# Patient Record
Sex: Male | Born: 1950 | Race: White | Hispanic: No | Smoking: Current every day smoker
Health system: Southern US, Community
[De-identification: ages and names within clinical notes are randomized; demographics above are authoritative.]

## PROBLEM LIST (undated history)

## (undated) DIAGNOSIS — I340 Nonrheumatic mitral (valve) insufficiency: Secondary | ICD-10-CM

## (undated) DIAGNOSIS — I10 Essential (primary) hypertension: Secondary | ICD-10-CM

## (undated) DIAGNOSIS — J449 Chronic obstructive pulmonary disease, unspecified: Secondary | ICD-10-CM

## (undated) DIAGNOSIS — E119 Type 2 diabetes mellitus without complications: Secondary | ICD-10-CM

## (undated) DIAGNOSIS — E1121 Type 2 diabetes mellitus with diabetic nephropathy: Secondary | ICD-10-CM

## (undated) DIAGNOSIS — I219 Acute myocardial infarction, unspecified: Secondary | ICD-10-CM

## (undated) DIAGNOSIS — G459 Transient cerebral ischemic attack, unspecified: Secondary | ICD-10-CM

## (undated) DIAGNOSIS — Z22322 Carrier or suspected carrier of Methicillin resistant Staphylococcus aureus: Secondary | ICD-10-CM

## (undated) DIAGNOSIS — G5691 Unspecified mononeuropathy of right upper limb: Secondary | ICD-10-CM

## (undated) DIAGNOSIS — K219 Gastro-esophageal reflux disease without esophagitis: Secondary | ICD-10-CM

## (undated) DIAGNOSIS — I509 Heart failure, unspecified: Secondary | ICD-10-CM

## (undated) HISTORY — PX: APPENDECTOMY: SHX54

## (undated) HISTORY — DX: Unspecified mononeuropathy of right upper limb: G56.91

## (undated) HISTORY — DX: Chronic obstructive pulmonary disease, unspecified: J44.9

## (undated) HISTORY — DX: Acute myocardial infarction, unspecified: I21.9

## (undated) HISTORY — DX: Gastro-esophageal reflux disease without esophagitis: K21.9

---

## 2009-12-11 ENCOUNTER — Inpatient Hospital Stay: Payer: Self-pay | Admitting: Internal Medicine

## 2010-05-01 ENCOUNTER — Emergency Department: Payer: Self-pay | Admitting: Emergency Medicine

## 2010-12-09 ENCOUNTER — Ambulatory Visit: Payer: Self-pay | Admitting: Family Medicine

## 2010-12-20 ENCOUNTER — Ambulatory Visit: Payer: Self-pay | Admitting: Family Medicine

## 2011-01-19 ENCOUNTER — Ambulatory Visit: Payer: Self-pay | Admitting: Family Medicine

## 2011-03-19 ENCOUNTER — Inpatient Hospital Stay: Payer: Self-pay | Admitting: Internal Medicine

## 2011-07-03 ENCOUNTER — Ambulatory Visit: Payer: Self-pay | Admitting: Internal Medicine

## 2012-05-11 ENCOUNTER — Emergency Department: Payer: Self-pay | Admitting: Emergency Medicine

## 2012-05-11 LAB — COMPREHENSIVE METABOLIC PANEL
Alkaline Phosphatase: 84 U/L (ref 50–136)
BUN: 9 mg/dL (ref 7–18)
Bilirubin,Total: 0.5 mg/dL (ref 0.2–1.0)
Co2: 28 mmol/L (ref 21–32)
Creatinine: 0.88 mg/dL (ref 0.60–1.30)
EGFR (African American): >60
EGFR (Non-African Amer.): >60
Glucose: 294 mg/dL — ABNORMAL HIGH (ref 65–99)
Osmolality: 278 (ref 275–301)
SGOT(AST): 29 U/L (ref 15–37)
SGPT (ALT): 18 U/L (ref 12–78)
Total Protein: 7.4 g/dL (ref 6.4–8.2)

## 2012-05-11 LAB — CBC
HCT: 45.4 % (ref 40.0–52.0)
HGB: 15.6 g/dL (ref 13.0–18.0)
MCV: 95 fL (ref 80–100)
Platelet: 274 10*3/uL (ref 150–440)
RBC: 4.75 10*6/uL (ref 4.40–5.90)
WBC: 11.1 10*3/uL — ABNORMAL HIGH (ref 3.8–10.6)

## 2013-03-03 ENCOUNTER — Ambulatory Visit: Payer: Self-pay

## 2013-03-08 ENCOUNTER — Ambulatory Visit: Payer: Self-pay

## 2013-04-15 ENCOUNTER — Ambulatory Visit: Payer: Self-pay

## 2013-05-23 ENCOUNTER — Inpatient Hospital Stay: Payer: Self-pay | Admitting: Internal Medicine

## 2013-05-23 LAB — CBC
HCT: 43.6 % (ref 40.0–52.0)
HGB: 14.8 g/dL (ref 13.0–18.0)
MCHC: 33.9 g/dL (ref 32.0–36.0)
MCV: 95 fL (ref 80–100)
Platelet: 356 10*3/uL (ref 150–440)
RBC: 4.59 10*6/uL (ref 4.40–5.90)
WBC: 29.1 10*3/uL — ABNORMAL HIGH (ref 3.8–10.6)

## 2013-05-23 LAB — COMPREHENSIVE METABOLIC PANEL
Albumin: 2.4 g/dL — ABNORMAL LOW (ref 3.4–5.0)
Alkaline Phosphatase: 108 U/L (ref 50–136)
Anion Gap: 7 (ref 7–16)
BUN: 15 mg/dL (ref 7–18)
Co2: 28 mmol/L (ref 21–32)
Creatinine: 1.04 mg/dL (ref 0.60–1.30)
EGFR (African American): >60
EGFR (Non-African Amer.): >60
Osmolality: 268 (ref 275–301)
Potassium: 4.2 mmol/L (ref 3.5–5.1)
SGOT(AST): 19 U/L (ref 15–37)
Total Protein: 7.5 g/dL (ref 6.4–8.2)

## 2013-05-23 LAB — CK TOTAL AND CKMB (NOT AT ARMC)
CK, Total: 107 U/L (ref 35–232)
CK, Total: 118 U/L (ref 35–232)
CK, Total: 86 U/L (ref 35–232)

## 2013-05-23 LAB — LIPID PANEL
Cholesterol: 142 mg/dL (ref 0–200)
Ldl Cholesterol, Calc: 94 mg/dL (ref 0–100)
Triglycerides: 150 mg/dL (ref 0–200)
VLDL Cholesterol, Calc: 30 mg/dL (ref 5–40)

## 2013-05-23 LAB — HEMOGLOBIN A1C: Hemoglobin A1C: 11.7 % — ABNORMAL HIGH (ref 4.2–6.3)

## 2013-05-24 LAB — BASIC METABOLIC PANEL WITH GFR
Anion Gap: 5 — ABNORMAL LOW
BUN: 13 mg/dL
Calcium, Total: 9.1 mg/dL
Chloride: 93 mmol/L — ABNORMAL LOW
Co2: 31 mmol/L
Creatinine: 1.14 mg/dL
EGFR (African American): >60
EGFR (Non-African Amer.): >60
Glucose: 263 mg/dL — ABNORMAL HIGH
Osmolality: 268
Potassium: 3.8 mmol/L
Sodium: 129 mmol/L — ABNORMAL LOW

## 2013-05-24 LAB — CBC WITH DIFFERENTIAL/PLATELET
Basophil #: 0.1 x10 3/mm 3
Basophil %: 0.3 %
Eosinophil #: 0.1 x10 3/mm 3
Eosinophil %: 0.6 %
HCT: 40.8 %
HGB: 13.7 g/dL
Lymphocyte %: 5 %
Lymphs Abs: 1.3 x10 3/mm 3
MCH: 32 pg
MCHC: 33.5 g/dL
MCV: 95 fL
Monocyte #: 1.8 "x10 3/mm " — ABNORMAL HIGH
Monocyte %: 6.8 %
Neutrophil #: 22.5 x10 3/mm 3 — ABNORMAL HIGH
Neutrophil %: 87.3 %
Platelet: 357 x10 3/mm 3
RBC: 4.28 x10 6/mm 3 — ABNORMAL LOW
RDW: 13 %
WBC: 25.8 x10 3/mm 3 — ABNORMAL HIGH

## 2013-05-26 LAB — WBC: WBC: 21.3 10*3/uL — ABNORMAL HIGH (ref 3.8–10.6)

## 2013-05-27 LAB — CBC WITH DIFFERENTIAL/PLATELET
Basophil #: 0.1 10*3/uL (ref 0.0–0.1)
Eosinophil #: 0.4 10*3/uL (ref 0.0–0.7)
HCT: 37 % — ABNORMAL LOW (ref 40.0–52.0)
HGB: 12.5 g/dL — ABNORMAL LOW (ref 13.0–18.0)
Lymphocyte #: 1.7 10*3/uL (ref 1.0–3.6)
MCH: 32.3 pg (ref 26.0–34.0)
MCHC: 33.8 g/dL (ref 32.0–36.0)
MCV: 96 fL (ref 80–100)
Monocyte #: 0.9 x10 3/mm (ref 0.2–1.0)
Monocyte %: 5.5 %
Neutrophil #: 13.2 10*3/uL — ABNORMAL HIGH (ref 1.4–6.5)
Neutrophil %: 81 %
RBC: 3.88 10*6/uL — ABNORMAL LOW (ref 4.40–5.90)
WBC: 16.2 10*3/uL — ABNORMAL HIGH (ref 3.8–10.6)

## 2013-05-27 LAB — BASIC METABOLIC PANEL
Anion Gap: 9 (ref 7–16)
BUN: 43 mg/dL — ABNORMAL HIGH (ref 7–18)
Calcium, Total: 8.4 mg/dL — ABNORMAL LOW (ref 8.5–10.1)
Creatinine: 4.3 mg/dL — ABNORMAL HIGH (ref 0.60–1.30)
EGFR (African American): 16 — ABNORMAL LOW
Sodium: 131 mmol/L — ABNORMAL LOW (ref 136–145)

## 2013-05-27 LAB — VANCOMYCIN, TROUGH: Vancomycin, Trough: 40 ug/mL (ref 10–20)

## 2013-05-27 LAB — CREATININE, SERUM: EGFR (Non-African Amer.): 14 — ABNORMAL LOW

## 2013-05-27 LAB — WOUND CULTURE

## 2013-05-28 LAB — URINALYSIS, COMPLETE
Ketone: NEGATIVE
Leukocyte Esterase: NEGATIVE
Nitrite: NEGATIVE
Ph: 5 (ref 4.5–8.0)
Protein: NEGATIVE
RBC,UR: 1 /HPF (ref 0–5)
Specific Gravity: 1.004 (ref 1.003–1.030)
Squamous Epithelial: NONE SEEN
WBC UR: <1 /HPF (ref 0–5)

## 2013-05-28 LAB — CREATININE, SERUM: EGFR (African American): 14 — ABNORMAL LOW

## 2013-05-28 LAB — PHOSPHORUS: Phosphorus: 5.2 mg/dL — ABNORMAL HIGH (ref 2.5–4.9)

## 2013-05-28 LAB — PROTEIN / CREATININE RATIO, URINE
Creatinine, Urine: 34.7 mg/dL (ref 30.0–125.0)
Protein/Creat. Ratio: 231 mg/gCREAT — ABNORMAL HIGH (ref 0–200)

## 2013-05-29 LAB — CBC WITH DIFFERENTIAL/PLATELET
Basophil #: 0.1 10*3/uL (ref 0.0–0.1)
Basophil %: 0.9 %
HCT: 38.7 % — ABNORMAL LOW (ref 40.0–52.0)
Lymphocyte #: 1.4 10*3/uL (ref 1.0–3.6)
Lymphocyte %: 9.2 %
MCH: 33 pg (ref 26.0–34.0)
MCV: 96 fL (ref 80–100)
Monocyte %: 7.6 %
Neutrophil %: 81.2 %
Platelet: 385 10*3/uL (ref 150–440)
RBC: 4.05 10*6/uL — ABNORMAL LOW (ref 4.40–5.90)
RDW: 13.5 % (ref 11.5–14.5)
WBC: 15.6 10*3/uL — ABNORMAL HIGH (ref 3.8–10.6)

## 2013-05-29 LAB — BASIC METABOLIC PANEL
Anion Gap: 5 — ABNORMAL LOW (ref 7–16)
BUN: 50 mg/dL — ABNORMAL HIGH (ref 7–18)
Calcium, Total: 9.1 mg/dL (ref 8.5–10.1)
Co2: 28 mmol/L (ref 21–32)
EGFR (African American): 14 — ABNORMAL LOW
EGFR (Non-African Amer.): 12 — ABNORMAL LOW
Glucose: 139 mg/dL — ABNORMAL HIGH (ref 65–99)
Potassium: 4.8 mmol/L (ref 3.5–5.1)
Sodium: 136 mmol/L (ref 136–145)

## 2013-05-30 LAB — RENAL FUNCTION PANEL
Albumin: 2.1 g/dL — ABNORMAL LOW (ref 3.4–5.0)
Anion Gap: 6 — ABNORMAL LOW (ref 7–16)
Calcium, Total: 8.8 mg/dL (ref 8.5–10.1)
Chloride: 105 mmol/L (ref 98–107)
Glucose: 98 mg/dL (ref 65–99)
Osmolality: 288 (ref 275–301)
Phosphorus: 6.1 mg/dL — ABNORMAL HIGH (ref 2.5–4.9)
Sodium: 138 mmol/L (ref 136–145)

## 2013-05-31 LAB — BASIC METABOLIC PANEL
Anion Gap: 5 — ABNORMAL LOW (ref 7–16)
BUN: 50 mg/dL — ABNORMAL HIGH (ref 7–18)
Creatinine: 4.71 mg/dL — ABNORMAL HIGH (ref 0.60–1.30)
Potassium: 5.1 mmol/L (ref 3.5–5.1)

## 2013-06-01 LAB — BASIC METABOLIC PANEL
Anion Gap: 3 — ABNORMAL LOW (ref 7–16)
BUN: 49 mg/dL — ABNORMAL HIGH (ref 7–18)
Chloride: 105 mmol/L (ref 98–107)
EGFR (African American): 15 — ABNORMAL LOW
EGFR (Non-African Amer.): 13 — ABNORMAL LOW
Glucose: 83 mg/dL (ref 65–99)
Osmolality: 284 (ref 275–301)
Sodium: 136 mmol/L (ref 136–145)

## 2013-06-02 LAB — CBC WITH DIFFERENTIAL/PLATELET
Basophil %: 0.8 %
Eosinophil #: 0.2 10*3/uL (ref 0.0–0.7)
Eosinophil %: 1.3 %
Lymphocyte #: 2.1 10*3/uL (ref 1.0–3.6)
Lymphocyte %: 14.4 %
MCHC: 33.8 g/dL (ref 32.0–36.0)
Monocyte #: 1.2 x10 3/mm — ABNORMAL HIGH (ref 0.2–1.0)
Neutrophil #: 10.8 10*3/uL — ABNORMAL HIGH (ref 1.4–6.5)
Neutrophil %: 75.4 %
Platelet: 462 10*3/uL — ABNORMAL HIGH (ref 150–440)
RDW: 13.7 % (ref 11.5–14.5)

## 2013-06-02 LAB — BASIC METABOLIC PANEL
BUN: 44 mg/dL — ABNORMAL HIGH (ref 7–18)
Calcium, Total: 9.2 mg/dL (ref 8.5–10.1)
Chloride: 104 mmol/L (ref 98–107)
Creatinine: 4.61 mg/dL — ABNORMAL HIGH (ref 0.60–1.30)
Osmolality: 284 (ref 275–301)
Potassium: 5.2 mmol/L — ABNORMAL HIGH (ref 3.5–5.1)

## 2013-06-02 LAB — VANCOMYCIN, TROUGH: Vancomycin, Trough: 14 ug/mL (ref 10–20)

## 2013-06-03 LAB — BASIC METABOLIC PANEL
Anion Gap: 5 — ABNORMAL LOW (ref 7–16)
BUN: 43 mg/dL — ABNORMAL HIGH (ref 7–18)
Calcium, Total: 9.1 mg/dL (ref 8.5–10.1)
EGFR (African American): 15 — ABNORMAL LOW
EGFR (Non-African Amer.): 13 — ABNORMAL LOW
Osmolality: 284 (ref 275–301)

## 2013-06-04 LAB — BASIC METABOLIC PANEL
BUN: 40 mg/dL — ABNORMAL HIGH (ref 7–18)
Chloride: 103 mmol/L (ref 98–107)
Creatinine: 4.07 mg/dL — ABNORMAL HIGH (ref 0.60–1.30)
Osmolality: 279 (ref 275–301)

## 2013-06-05 LAB — BASIC METABOLIC PANEL
Anion Gap: 7 (ref 7–16)
BUN: 38 mg/dL — ABNORMAL HIGH (ref 7–18)
Calcium, Total: 9.3 mg/dL (ref 8.5–10.1)
Chloride: 103 mmol/L (ref 98–107)
Creatinine: 3.9 mg/dL — ABNORMAL HIGH (ref 0.60–1.30)
EGFR (African American): 18 — ABNORMAL LOW
Glucose: 84 mg/dL (ref 65–99)
Osmolality: 278 (ref 275–301)
Sodium: 135 mmol/L — ABNORMAL LOW (ref 136–145)

## 2013-06-06 LAB — BASIC METABOLIC PANEL
BUN: 37 mg/dL — ABNORMAL HIGH (ref 7–18)
Calcium, Total: 8.8 mg/dL (ref 8.5–10.1)
Creatinine: 3.85 mg/dL — ABNORMAL HIGH (ref 0.60–1.30)
EGFR (African American): 18 — ABNORMAL LOW
EGFR (Non-African Amer.): 16 — ABNORMAL LOW
Glucose: 131 mg/dL — ABNORMAL HIGH (ref 65–99)
Osmolality: 281 (ref 275–301)
Potassium: 4.8 mmol/L (ref 3.5–5.1)

## 2013-06-06 LAB — PROTEIN ELECTROPHORESIS(ARMC)

## 2013-06-09 LAB — BASIC METABOLIC PANEL
Anion Gap: 6 — ABNORMAL LOW (ref 7–16)
BUN: 37 mg/dL — ABNORMAL HIGH (ref 7–18)
Calcium, Total: 9.1 mg/dL (ref 8.5–10.1)
Co2: 26 mmol/L (ref 21–32)
EGFR (Non-African Amer.): 20 — ABNORMAL LOW
Glucose: 229 mg/dL — ABNORMAL HIGH (ref 65–99)
Potassium: 4.6 mmol/L (ref 3.5–5.1)
Sodium: 129 mmol/L — ABNORMAL LOW (ref 136–145)

## 2013-07-11 LAB — COMPREHENSIVE METABOLIC PANEL
Alkaline Phosphatase: 99 U/L
Anion Gap: 9 (ref 7–16)
Calcium, Total: 9.5 mg/dL (ref 8.5–10.1)
Chloride: 93 mmol/L — ABNORMAL LOW (ref 98–107)
Co2: 25 mmol/L (ref 21–32)
EGFR (African American): >60
EGFR (Non-African Amer.): 56 — ABNORMAL LOW
Osmolality: 273 (ref 275–301)
Potassium: 4.3 mmol/L (ref 3.5–5.1)
SGPT (ALT): 28 U/L (ref 12–78)
Sodium: 127 mmol/L — ABNORMAL LOW (ref 136–145)
Total Protein: 8.6 g/dL — ABNORMAL HIGH (ref 6.4–8.2)

## 2013-07-11 LAB — CBC
HCT: 35.4 % — ABNORMAL LOW (ref 40.0–52.0)
MCH: 29.1 pg (ref 26.0–34.0)
MCHC: 31.8 g/dL — ABNORMAL LOW (ref 32.0–36.0)
MCV: 92 fL (ref 80–100)
Platelet: 395 10*3/uL (ref 150–440)
RBC: 3.87 10*6/uL — ABNORMAL LOW (ref 4.40–5.90)
RDW: 13.9 % (ref 11.5–14.5)
WBC: 18.9 10*3/uL — ABNORMAL HIGH (ref 3.8–10.6)

## 2013-07-11 LAB — LIPASE, BLOOD: Lipase: 248 U/L (ref 73–393)

## 2013-07-12 ENCOUNTER — Inpatient Hospital Stay: Payer: Self-pay | Admitting: Internal Medicine

## 2013-07-12 LAB — URINALYSIS, COMPLETE
Bilirubin,UR: NEGATIVE
Blood: NEGATIVE
Glucose,UR: >=500 mg/dL (ref 0–75)
Hyaline Cast: 1
Nitrite: NEGATIVE
RBC,UR: 8 /HPF (ref 0–5)
Specific Gravity: 1.014 (ref 1.003–1.030)
WBC UR: 11 /HPF (ref 0–5)

## 2013-07-13 LAB — BASIC METABOLIC PANEL
Anion Gap: 5 — ABNORMAL LOW (ref 7–16)
BUN: 17 mg/dL (ref 7–18)
Calcium, Total: 8.9 mg/dL (ref 8.5–10.1)
Co2: 30 mmol/L (ref 21–32)
EGFR (African American): >60
EGFR (Non-African Amer.): >60
Glucose: 253 mg/dL — ABNORMAL HIGH (ref 65–99)
Osmolality: 275 (ref 275–301)
Sodium: 132 mmol/L — ABNORMAL LOW (ref 136–145)

## 2013-07-13 LAB — CBC WITH DIFFERENTIAL/PLATELET
Basophil #: 0.1 10*3/uL (ref 0.0–0.1)
Eosinophil #: 0.4 10*3/uL (ref 0.0–0.7)
Eosinophil %: 3.2 %
HGB: 10.4 g/dL — ABNORMAL LOW (ref 13.0–18.0)
MCH: 30.2 pg (ref 26.0–34.0)
MCHC: 32.6 g/dL (ref 32.0–36.0)
Monocyte #: 1.1 x10 3/mm — ABNORMAL HIGH (ref 0.2–1.0)
Monocyte %: 7.8 %
Neutrophil #: 9.7 10*3/uL — ABNORMAL HIGH (ref 1.4–6.5)
RBC: 3.45 10*6/uL — ABNORMAL LOW (ref 4.40–5.90)
RDW: 14.2 % (ref 11.5–14.5)
WBC: 13.8 10*3/uL — ABNORMAL HIGH (ref 3.8–10.6)

## 2013-07-13 LAB — CLOSTRIDIUM DIFFICILE(ARMC)

## 2013-07-14 LAB — CBC WITH DIFFERENTIAL/PLATELET
Basophil #: 0.1 10*3/uL (ref 0.0–0.1)
Basophil %: 0.7 %
HCT: 32.5 % — ABNORMAL LOW (ref 40.0–52.0)
Lymphocyte #: 2.1 10*3/uL (ref 1.0–3.6)
Lymphocyte %: 18.7 %
MCH: 32.1 pg (ref 26.0–34.0)
MCV: 92 fL (ref 80–100)
Monocyte #: 0.8 x10 3/mm (ref 0.2–1.0)
Monocyte %: 6.9 %
Neutrophil %: 69.1 %
Platelet: 410 10*3/uL (ref 150–440)
RDW: 14 % (ref 11.5–14.5)
WBC: 11.3 10*3/uL — ABNORMAL HIGH (ref 3.8–10.6)

## 2013-12-25 ENCOUNTER — Inpatient Hospital Stay: Payer: Self-pay | Admitting: Internal Medicine

## 2013-12-25 LAB — CBC
HCT: 45.2 % (ref 40.0–52.0)
HGB: 15.3 g/dL (ref 13.0–18.0)
MCH: 31.6 pg (ref 26.0–34.0)
MCHC: 33.8 g/dL (ref 32.0–36.0)
MCV: 94 fL (ref 80–100)
PLATELETS: 328 10*3/uL (ref 150–440)
RBC: 4.84 10*6/uL (ref 4.40–5.90)
RDW: 13.7 % (ref 11.5–14.5)
WBC: 17.9 10*3/uL — ABNORMAL HIGH (ref 3.8–10.6)

## 2013-12-25 LAB — URINALYSIS, COMPLETE
BILIRUBIN, UR: NEGATIVE
Bacteria: NONE SEEN
Blood: NEGATIVE
Hyaline Cast: 2
Nitrite: NEGATIVE
PH: 5 (ref 4.5–8.0)
Protein: NEGATIVE
RBC,UR: 2 /HPF (ref 0–5)
SPECIFIC GRAVITY: 1.029 (ref 1.003–1.030)
WBC UR: 7 /HPF (ref 0–5)

## 2013-12-25 LAB — COMPREHENSIVE METABOLIC PANEL
ALBUMIN: 3.3 g/dL — AB (ref 3.4–5.0)
ALT: 18 U/L (ref 12–78)
AST: 18 U/L (ref 15–37)
Alkaline Phosphatase: 79 U/L
Anion Gap: 8 (ref 7–16)
BUN: 12 mg/dL (ref 7–18)
Bilirubin,Total: 0.7 mg/dL (ref 0.2–1.0)
CALCIUM: 8.8 mg/dL (ref 8.5–10.1)
CHLORIDE: 100 mmol/L (ref 98–107)
CO2: 25 mmol/L (ref 21–32)
Creatinine: 1.16 mg/dL (ref 0.60–1.30)
GLUCOSE: 290 mg/dL — AB (ref 65–99)
Osmolality: 277 (ref 275–301)
Potassium: 4.1 mmol/L (ref 3.5–5.1)
SODIUM: 133 mmol/L — AB (ref 136–145)
Total Protein: 7.8 g/dL (ref 6.4–8.2)

## 2013-12-27 LAB — CBC WITH DIFFERENTIAL/PLATELET
Basophil #: 0.1 10*3/uL (ref 0.0–0.1)
Basophil %: 0.4 %
Eosinophil #: 0.2 10*3/uL (ref 0.0–0.7)
Eosinophil %: 1.6 %
HCT: 39.7 % — ABNORMAL LOW (ref 40.0–52.0)
HGB: 13.1 g/dL (ref 13.0–18.0)
LYMPHS ABS: 2.3 10*3/uL (ref 1.0–3.6)
Lymphocyte %: 15.9 %
MCH: 31.8 pg (ref 26.0–34.0)
MCHC: 33.1 g/dL (ref 32.0–36.0)
MCV: 96 fL (ref 80–100)
MONO ABS: 1.2 x10 3/mm — AB (ref 0.2–1.0)
Monocyte %: 8.3 %
NEUTROS ABS: 10.5 10*3/uL — AB (ref 1.4–6.5)
NEUTROS PCT: 73.8 %
PLATELETS: 258 10*3/uL (ref 150–440)
RBC: 4.13 10*6/uL — ABNORMAL LOW (ref 4.40–5.90)
RDW: 12.9 % (ref 11.5–14.5)
WBC: 14.3 10*3/uL — ABNORMAL HIGH (ref 3.8–10.6)

## 2013-12-28 LAB — CBC WITH DIFFERENTIAL/PLATELET
Basophil #: 0.1 10*3/uL (ref 0.0–0.1)
Basophil %: 1.1 %
EOS ABS: 0.2 10*3/uL (ref 0.0–0.7)
Eosinophil %: 1.4 %
HCT: 38.3 % — ABNORMAL LOW (ref 40.0–52.0)
HGB: 12.8 g/dL — AB (ref 13.0–18.0)
LYMPHS ABS: 1.7 10*3/uL (ref 1.0–3.6)
LYMPHS PCT: 13 %
MCH: 31.5 pg (ref 26.0–34.0)
MCHC: 33.5 g/dL (ref 32.0–36.0)
MCV: 94 fL (ref 80–100)
MONOS PCT: 8.8 %
Monocyte #: 1.2 x10 3/mm — ABNORMAL HIGH (ref 0.2–1.0)
Neutrophil #: 10.2 10*3/uL — ABNORMAL HIGH (ref 1.4–6.5)
Neutrophil %: 75.7 %
Platelet: 268 10*3/uL (ref 150–440)
RBC: 4.07 10*6/uL — AB (ref 4.40–5.90)
RDW: 13.3 % (ref 11.5–14.5)
WBC: 13.4 10*3/uL — ABNORMAL HIGH (ref 3.8–10.6)

## 2013-12-29 LAB — CBC WITH DIFFERENTIAL/PLATELET
BASOS ABS: 0.1 10*3/uL (ref 0.0–0.1)
BASOS PCT: 0.4 %
EOS PCT: 2.4 %
Eosinophil #: 0.3 10*3/uL (ref 0.0–0.7)
HCT: 37.2 % — ABNORMAL LOW (ref 40.0–52.0)
HGB: 12.4 g/dL — ABNORMAL LOW (ref 13.0–18.0)
Lymphocyte #: 1.9 10*3/uL (ref 1.0–3.6)
Lymphocyte %: 14.5 %
MCH: 31.6 pg (ref 26.0–34.0)
MCHC: 33.3 g/dL (ref 32.0–36.0)
MCV: 95 fL (ref 80–100)
Monocyte #: 1 x10 3/mm (ref 0.2–1.0)
Monocyte %: 8.2 %
Neutrophil #: 9.5 10*3/uL — ABNORMAL HIGH (ref 1.4–6.5)
Neutrophil %: 74.5 %
Platelet: 281 10*3/uL (ref 150–440)
RBC: 3.92 10*6/uL — ABNORMAL LOW (ref 4.40–5.90)
RDW: 12.9 % (ref 11.5–14.5)
WBC: 12.8 10*3/uL — ABNORMAL HIGH (ref 3.8–10.6)

## 2013-12-30 LAB — CBC WITH DIFFERENTIAL/PLATELET
BASOS ABS: 0.1 10*3/uL (ref 0.0–0.1)
BASOS PCT: 0.7 %
Eosinophil #: 0.4 10*3/uL (ref 0.0–0.7)
Eosinophil %: 4 %
HCT: 39.4 % — ABNORMAL LOW (ref 40.0–52.0)
HGB: 13.1 g/dL (ref 13.0–18.0)
LYMPHS PCT: 17.6 %
Lymphocyte #: 1.9 10*3/uL (ref 1.0–3.6)
MCH: 31.7 pg (ref 26.0–34.0)
MCHC: 33.2 g/dL (ref 32.0–36.0)
MCV: 95 fL (ref 80–100)
MONO ABS: 0.9 x10 3/mm (ref 0.2–1.0)
Monocyte %: 8.1 %
NEUTROS PCT: 69.6 %
Neutrophil #: 7.4 10*3/uL — ABNORMAL HIGH (ref 1.4–6.5)
PLATELETS: 304 10*3/uL (ref 150–440)
RBC: 4.13 10*6/uL — ABNORMAL LOW (ref 4.40–5.90)
RDW: 13.1 % (ref 11.5–14.5)
WBC: 10.6 10*3/uL (ref 3.8–10.6)

## 2013-12-30 LAB — BASIC METABOLIC PANEL
Anion Gap: 7 (ref 7–16)
BUN: 8 mg/dL (ref 7–18)
CALCIUM: 9.4 mg/dL (ref 8.5–10.1)
Chloride: 101 mmol/L (ref 98–107)
Co2: 27 mmol/L (ref 21–32)
Creatinine: 0.89 mg/dL (ref 0.60–1.30)
GLUCOSE: 265 mg/dL — AB (ref 65–99)
OSMOLALITY: 278 (ref 275–301)
POTASSIUM: 3.8 mmol/L (ref 3.5–5.1)
Sodium: 135 mmol/L — ABNORMAL LOW (ref 136–145)

## 2013-12-30 LAB — CULTURE, BLOOD (SINGLE)

## 2014-01-02 LAB — WOUND CULTURE

## 2014-09-11 ENCOUNTER — Emergency Department: Payer: Self-pay | Admitting: Emergency Medicine

## 2014-10-19 ENCOUNTER — Emergency Department
Admit: 2014-10-19 | Disposition: A | Discharge status: Home / Self Care | Payer: Self-pay | Admitting: Emergency Medicine

## 2014-10-19 LAB — CBC
HCT: 46.7 % (ref 40.0–52.0)
HGB: 15.8 g/dL (ref 13.0–18.0)
MCH: 31.5 pg (ref 26.0–34.0)
MCHC: 33.8 g/dL (ref 32.0–36.0)
MCV: 93 fL (ref 80–100)
Platelet: 264 10*3/uL (ref 150–440)
RBC: 5.02 10*6/uL (ref 4.40–5.90)
RDW: 13.3 % (ref 11.5–14.5)
WBC: 8.3 10*3/uL (ref 3.8–10.6)

## 2014-10-19 LAB — TROPONIN I
TROPONIN-I: 0.03 ng/mL
Troponin-I: <0.03 ng/mL

## 2014-10-19 LAB — BASIC METABOLIC PANEL
Anion Gap: 10 (ref 7–16)
BUN: 15 mg/dL
Calcium, Total: 9.2 mg/dL
Chloride: 96 mmol/L — ABNORMAL LOW
Co2: 24 mmol/L
Creatinine: 0.83 mg/dL
Glucose: 284 mg/dL — ABNORMAL HIGH
POTASSIUM: 3.6 mmol/L
SODIUM: 130 mmol/L — AB

## 2014-11-10 NOTE — Discharge Summary (Signed)
PATIENT NAME:  Nathan Caldwell, Nathan Caldwell MR#:  [id number] DATE OF BIRTH:  03-04-1951  DATE OF ADMISSION:  05/23/2013 DATE OF DISCHARGE:  06/07/2013  Please see interim discharge summary dictated by Dr. Enid Baas on 12th of November.   FINAL DISCHARGE DIAGNOSES: 1.  Methicillin-resistant staph aureus abscess on the back, status post incision and drainage with  Penrose drain in place.  2.  Acute renal failure, likely secondary to acute tubular necrosis from antibiotics, improving.   SECONDARY DIAGNOSES: 1.  Diabetes.  2.  Hypertension.  3.  Morbid obesity.  4.  Hyperlipidemia.  5.  History of coronary artery disease.   CONSULTATIONS: As dictated by Dr. Nemiah Commander in the interim summary on 12th of November. No new consultations were obtained subsequent to that.   PROCEDURES AND RADIOLOGY: As dictated by Dr. Nemiah Commander in the interim summary on 12th of November. No new radiology or procedures were obtained after that.   LABORATORY PANEL:  Serum ANA on 14th of November was negative. ANCA panel was negative. Antiglomerular basement membrane antibody was within normal limits. Serum protein electrophoresis was within normal limits.   HISTORY AND SHORT HOSPITAL COURSE: The patient is a 64 year old male with above-mentioned medical problems who was admitted for abscess on the back. Please see Dr. Suzanne Boron dictated history and physical for further details. The patient was started on broad-spectrum antibiotics with IV vancomycin and Zosyn. Surgical consultation was obtained with Dr. Juliann Pulse, who did incision and drainage on 4th of November with debridement of the abscess which was 9 x 9 x 3 cm and Penrose drain was placed. Please see Dr. Prudencio Pair dictated interim discharge summary on the 12th of November for detailed course from admission until 12th of November.   The patient was found to have acute renal failure throughout his admission course which was thought to be due to ATN secondary to MRSA  sepsis and likely contribution from elevated vancomycin level. This was nonoliguric and nephrology consultation was obtained, who recommended gentle hydration which was continued. The patient's creatinine peaked  up to 5.05 on 10th of November and subsequently it is trending down. The patient's antibiotic course has been finished. He was switched over to Zyvox due to vancomycin toxicity. He is feeling significantly better and is waiting at this point for bed placement. If he does get a bed available, he will be discharged today.   PERTINENT PHYSICAL EXAMINATION ON THE DATE OF DISCHARGE:   HIS VITAL SIGNS ARE AS FOLLOWS: Temperature 97.7, heart rate 62 per minute, respirations 20 per minute, blood pressure 143/74 mmHg, saturating 94% on room air.  CARDIOVASCULAR: S1, S2 normal. No murmurs, rubs or gallop.  LUNGS: Clear to auscultation bilaterally. No wheezing, rales, rhonchi or crepitation.  ABDOMEN: Soft, benign.  SKIN:  He has a dressing present in the back on his left side.  NEUROLOGIC: Alert and oriented, All other physical examination remained at baseline.   DISCHARGE MEDICATIONS:  1.  Trazodone 50 mg p.o. at bedtime.  2.  Gabapentin 600 mg p.o. 3 times a day.  3.  Advair 250/50 one puff b.i.d.  4.  Lipitor 40 mg p.o. at bedtime 5.  Spiriva once daily.  6.  Toprol-XL 50 mg p.o. daily.  7.  Insulin detemir 50 units subcutaneous twice a day.  8.  Aspirin 81 mg p.o. daily.  9.  Imdur 30 mg p.o. daily. 10. Amlodipine 10 mg p.o. daily.  11. Hydralazine 50 mg p.o. 4 times a day.   DISCHARGE DIET: Low sodium, low  fat, low cholesterol, 1800 ADA and renal diet.   DISCHARGE ACTIVITY: As tolerated.   DISCHARGE INSTRUCTIONS AND FOLLOWUP: The patient was instructed to keep his Penrose drain in place and have followup with Dr. Ida Roguehristopher Lundquist in 1 week. [facility]. Please check basic metabolic panel on 20th of November with  results forwarded to primary care physician and Dr. Mosetta PigeonHarmeet Singh for renal function monitoring. He will need followup with Dr. Mosetta PigeonHarmeet Singh from nephrology in 2 to 3 days for renal function monitoring.   TOTAL TIME SPENT ON THIS PATIENT: 55 minutes.   ____________________________ Ellamae SiaVipul S. Sherryll BurgerShah, MD vss:cs D: 06/07/2013 13:46:02 ET T: 06/07/2013 14:47:47 ET JOB#: [id number]  cc: Danity Schmelzer S. Sherryll BurgerShah, MD, <Dictator> [facility] Christopher A. Juliann PulseLundquist, MD Mosetta PigeonHarmeet Singh, MD Patricia PesaVIPUL S Nidhi Jacome MD ELECTRONICALLY SIGNED 06/09/2013 16:24

## 2014-11-10 NOTE — H&P (Signed)
PATIENT NAME:  Nathan Caldwell, Nathan Caldwell MR#:  [id number] DATE OF BIRTH:  [birth year]  DATE OF ADMISSION:  05/23/2013  PRIMARY CARE DOCTOR: [facility].   CHIEF COMPLAINT: Abscess on the back.   HISTORY OF PRESENT ILLNESS: A 64 year old male patient with hypertension, diabetes, coronary artery disease, poor compliance with medications. Started to have swelling and abscess on the back side on the right side for about two weeks. The patient went to [facility] two weeks and was given Bactrim, but the patient persistently had swelling, pain and low grade temperature, because of that the patient came. The patient also had trouble lying on that side. The patient had lots of pus drained from the abscess on the right upper back and we are asked to admit the patient. The patient denies any shortness of breath. No chest pain. No cough. No fever. No history of travel. Says that he does not take medications regularly because he is worried about side effects but he says that he does take Lantus 50 units b.i.d. and also NovoLog for his diabetes. The patient denies any nausea, vomiting.  PAST MEDICAL HISTORY: Significant for diabetes, hypertension, morbid obesity, hyperlipidemia, ongoing tobacco abuse, history of coronary artery disease.   ALLERGIES: No known allergies.   Past medical history also includes history of cardiac catheterization in 2012. The patient had a cath by Dr. Juliann Pares at that time it showed 93% occlusion of diagonal branch. The patient advised to have medical treatment with aspirin, beta blockers, ACE inhibitors and nitroglycerin.   MEDICATIONS:  The patient is supposed to be on:  1. Percocet 5/325, 1 to 2 tablets every 4 to 6 hours as needed.  2. Advair Diskus 250/50, 1 puff b.i.d.  3. Aspirin 81 mg daily.  4. Bacitracin 500 units per gram ointment (to back  .  5. Imdur 30 mg extended release daily.  6. Lantus 50 units subcutaneous b.i.d.  7. Lipitor 40 mg p.o. daily.  8. Lisinopril 10  mg p.o. daily.  9. Spiriva 18 mcg inhalation daily. 10. Toprol-XL 50 mg p.o. daily.   SOCIAL HISTORY: Active tobacco abuse. The patient's last smoke was 3 days ago. The patient says that he smokes 2 packs per day. No alcohol. No drugs. Lives alone.   FAMILY HISTORY: Significant for hypertension, diabetes, and sister had heart attack, and dad also had a heart attack.   PAST SURGICAL HISTORY: None.   REVIEW OF SYSTEMS:  CONSTITUTIONAL: Has no fever, no fatigue. Does have a lot of pain in the back.  EYES: No blurred vision.  ENT: No tinnitus. No ear pain. No epistaxis. No difficulty swallowing.  RESPIRATORY: No cough. No wheezing.  CARDIOVASCULAR: No chest pain, orthopnea, no PND or pedal edema.  GASTROINTESTINAL: No nausea. No vomiting. No abdominal pain.  GENITOURINARY: No dysuria.  ENDOCRINE: The patient has no polyuria or nocturia.  HEMATOLOGIC: No anemia or easy bruising.  INTEGUMENTARY: The patient does have multiple abscesses,  mainly the big one is on the back on the right side upper back, but healed abscess is present on the nose,also  on  small areas of abscesses on the front of the chest in between the two breasts.Marland Kitchen  NEUROLOGIC: No numbness or weakness. No dysarthria.  PSYCHIATRIC: No anxiety or insomnia.   PHYSICAL EXAMINATION: VITAL SIGNS: Temperature 99.4, heart rate (94 , blood pressure 130/64, sats 92% on room air.  GENERAL: The patient is alert, awake, oriented, very uncomfortable because he cannot lay back on the right side, he  cannot lay back on the back at all because of the abscess and extreme amount of pain he has.  SKIN: Shows warm and dry skin. Does have a tender and irritated area on the back. The patient had a incision and drainage by the ER physician: Drained a lot of pus. Still has redness and hardened area in the right upper back, and also has a healed abscess on the right side of the nose.   CARDIOVASCULAR: S1, S2 regular. No murmurs.  HEENT: Head:  Atraumatic, normocephalic. Eyes: Pupils equally reacting to light. Extraocular movements intact. ENT: No tympanic membrane, congestion. No definite hypertrophy No turbinate hypertrophy. Does have a healed lesion on the nose on the right side. Mouth: Poor dentition. Throat: No pharyngeal erythema.  NECK: Supple. No JVD. No carotid bruit. Thyroid is not enlarged.  RESPIRATORY: Clear to auscultation. No wheeze. No rales.  CARDIOVASCULAR: S1, S2 regular. No murmurs. PMI not displaced.  ABDOMEN: Soft, nontender, nondistended. Bowel sounds present. No hepatosplenomegaly.  MUSCULOSKELETAL: Strength 5/5 in upper and lower extremities.  SKIN: Does have area  erythema and induration on right upper back and tenderness to palpation, The area is about 5 cm in diameter and type 3 to 4 cm lengthwise.  LYMPHATIC: No lymphadenopathy.  NEUROLOGIC: Cranial nerves II through XII intact. Power five out of five upper and lower extremities. Sensations are intact. DTRs 2+ bilaterally. PSYCHIATRIC: Oriented to time, place, person.   LABORATORY DATA: Sodium 125, potassium 4.2, chloride 90, bicarbonate 28, BUN 15, creatinine 1,  glucose 385. WBC 29.1, hemoglobin 14.8, hematocrit 43.6, platelets 356. EKG is not done.   ASSESSMENT AND PLAN: 1. The patient is a 64 year old male patient with an abscess on the back. The patient is going to be admitted to Med[facility]urg. Already drained abscess. Follow the cultures. Continue vancomycin and Zosyn. The patient will get surgical consult to see if the patient needs further drainage of abscess. The patient has multiple other small abscess, which are healed.  2. Hyponatremia, probably secondary to dehydration. Continue IV fluids. Normal saline at 80 mL an hour. Check BMP in the morning.  3. Diabetes mellitus type 2 poorly controlled. The patient noncompliant with medications. Continue Lantus along with sliding scale coverage. Obtain  hemoglobin A1c and lipids.  3. History of coronary artery  disease. The patient had a cardiac catheter done two years ago. The patient is on aspirin and statins, beta blockers and nitroglycerin. Continue them.  4. Continue diabetic diet.   TIME SPENT: About 60 minutes.    ____________________________ Katha HammingSnehalatha Cole Klugh, MD sk:sg D: 05/23/2013 12:23:00 ET T: 05/23/2013 12:51:38 ET JOB#: [id number]  cc: Katha HammingSnehalatha Averi Kilty, MD, <Dictator> Katha HammingSNEHALATHA Lynkin Saini MD ELECTRONICALLY SIGNED 06/27/2013 11:11

## 2014-11-10 NOTE — Consult Note (Signed)
PATIENT NAME:  Nathan Caldwell, Nathan Caldwell MR#:  [id number] DATE OF BIRTH:  March 21, 1951  DATE OF CONSULTATION:  06/09/2013 PSYCHIATRIC CONSULTATION    CONSULTING PHYSICIAN:  Ardeen Fillers. Garnetta Buddy, MD REQUESTING PHYSICIAN: Dr. Allena Katz.  HISTORY OF PRESENT ILLNESS: The patient is a 64 year old male with history of hypertension, diabetes, coronary artery disease and poor compliance with medication who was admitted due to swelling and abscess on the back on the right side. He reported that he has been having these issues for the past two weeks. The patient follows at the [facility], and was given Bactrim. However, the patient continues to have persistent swelling and lower back pain with temperature. He was admitted to the hospital and was diagnosed with MRSA. The patient was treated for the same. However, as he was about to be discharged to the home and the social worker was helping with the discharge planning the patient expressed some suicidal ideations. Psychiatric consult was placed due to the same.   During my interview, the patient reported that he has been in the hospital for the past three weeks due to MRSA. He is ready to go home. He reported that he currently lives by himself, and is able to prepare his own food. He reported that nobody will take him because of his MRSA. He reported that this is all related to money. He was about to be discharged to the [facility], but when the [employer] admission coordinator came, they decided not to take him because of the financial issues. The patient has stated that he does not have enough money and he is tired because the social worker has been trying to help him. He reported that she tried to make him say that he is going to kill his own life, but he does not want to do the same. He reported that he used to own a bunch of guns, but now he does not have any one of them. 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 The patient reported that he does not have any  family support, but some of his friends help him. He reported that he is not having any suicidal or homicidal ideations or plans at this time.   PAST PSYCHIATRIC HISTORY: The patient reported that he has never seen a psychiatrist and does not take any psychotropic medications. He feels safe and stable at this time. He reported that he is ready to be discharged and one of his friends can come and pick him up.   MEDICAL HISTORY: MRSA on the back status post incision and drainage with a Penrose drain in place,  acute renal failure secondary to acute tubular necrosis from antibiotics improving, diabetes, hypertension, obesity, hyperlipidemia, history of coronary artery disease.   CURRENT MEDICATIONS: Trazodone 50 mg at bedtime, gabapentin 600 mg 3 times a day, Advair 250/50 1 puff b.i.d., Lipitor 40 mg at bedtime,  Spiriva once daily, Toprol-XL 50 mg daily, insulin detemir 50 units subcutaneous twice a day, aspirin 81 mg daily, Imdur 30 mg daily, amlodipine 10 mg daily  SOCIAL HISTORY: The patient reported that he lives by himself. He has a son and a daughter, but they are not in contact with him.   MENTAL STATUS EXAMINATION: The patient is a moderately built male who was lying in the bed. He maintained fair eye contact. His mood was fine. Affect was congruent. Thought process was logical, goal-directed. Thought content was non delusional. He currently denied having any suicidal or homicidal  ideations or plans. He demonstrated fair insight and judgment. He denied having any perceptual disturbances.   DIAGNOSTIC IMPRESSION: AXIS I: Mood disorder.  AXIS II: None.  AXIS III: Please review the medical history.   TREATMENT PLAN: I reviewed his medical records and his medications in detail. The patient currently denied having any suicidal thoughts. He can be discharged when he is medically stable. No new medications will be added at this time.   Thank you for allowing me to participate in the care of this  patient.   ____________________________ Ardeen FillersUzma S. Garnetta BuddyFaheem, MD usf:sg D: 06/09/2013 13:40:47 ET T: 06/09/2013 14:39:40 ET JOB#: [id number]  cc: Ardeen FillersUzma S. Garnetta BuddyFaheem, MD, <Dictator> Rhunette CroftUZMA S Trey Bebee MD ELECTRONICALLY SIGNED 06/14/2013 13:59

## 2014-11-10 NOTE — Op Note (Signed)
PATIENT NAME:  Nathan Caldwell, Nathan Caldwell MR#:  [id number] DATE OF BIRTH:  25-Oct-1950  DATE OF PROCEDURE:  05/24/2013  PREOPERATIVE DIAGNOSIS: Left back abscess.   POSTOPERATIVE DIAGNOSIS: Left back abscess 9 x 9 x 3 cm.   PROCEDURE  PERFORMED: Incision and drainage and debridement of left back abscess 9 x 9 x  3 cm.   ESTIMATED BLOOD LOSS: 25 mL.   COMPLICATIONS: None.   SPECIMENS: None.   ANESTHESIA: General.   INDICATION FOR SURGERY: [name] is a pleasant 64 year old male who presented with a large left back abscess which I thought was in need of incision and drainage and debridement.   DETAILS OF PROCEDURE:Informed consent was obtained. [name] brought to the Operating Room suite. He was induced. Endotracheal tube was placed, general anesthesia was given. He was then placed in the right lateral decubitus position and his back was then prepped and draped in standard surgical fashion. A timeout was then performed correctly identifying the patient name, operative site and procedure to be performed. An incision was made over the area of purulence. This was deepened down to large area of necrotic tissue. This cavity was then opened up and found to be quite large and it was debrided with a combination of Bovie electrocautery and hemostats. Three counter incisions were made at the medial aspects of the cavity, 3 half-inch Penrose drains were then sutured through these tracks with 3-0 nylon sutures. The wound, when hemostatic and well debrided was packed with iodoform-soaked Kerlix and ABD pad and then paper tape were used to complete the dressing. The patient was then awoken, extubated and brought to the postanesthesia care unit. There were no immediate complications. Needle, sponge, and instrument counts were correct at the end of the procedure.    ____________________________ Si Raiderhristopher A. Corrinna Karapetyan, MD cal:sg D: 05/25/2013 13:09:05 ET T: 05/25/2013 13:44:31 ET JOB#: [id number]  cc: Cristal Deerhristopher  A. Smriti Barkow, MD, <Dictator> Jarvis NewcomerHRISTOPHER A Louvina Cleary MD ELECTRONICALLY SIGNED 05/25/2013 15:20

## 2014-11-10 NOTE — Discharge Summary (Signed)
PATIENT NAME:  Nathan Caldwell, Nathan Caldwell MR#:  [id number] DATE OF BIRTH:  1950/12/19  DATE OF ADMISSION:  07/12/2013 DATE OF DISCHARGE:  07/14/2013  PRIMARY CARE PHYSICIAN:  None local.  DISCHARGE DIAGNOSES:  Back cellulitis, urinary tract infection, hyponatremia, hypertension, diabetes, coronary artery disease, chronic kidney disease .  CODE STATUS: Full code.   CONDITION: Stable.   HOME MEDICATIONS: [facility] reconciliation list.   DIET: Low-sodium, low-fat, low-cholesterol ADA diet.   ACTIVITY: As tolerated.   FOLLOWUP CARE:  Follow up with PCP within 1 to 2 weeks.    REASON FOR ADMISSION: Abdominal pain, nausea, diarrhea, back cellulitis.   HOSPITAL COURSE: The patient is a 64 year old Caucasian male with a history of CAD, hypertension, diabetes, hyperlipidemia, who presented to the ED with a history of MRSA on the back status post I and D.  Presented to the ED with abdominal pain, nausea, diarrhea and back cellulitis. The patient denied any fever or chills at home, but his WBC was 19,000 with significant cellulitis on the back. CAT scan of the chest did not show any evidence of abscess. The patient was treated with IV clindamycin in ED. For detailed history and physical examination, please refer to the admission note dictated by Dr. Randol KernElgergawy. On admission date, the patient's glucose was 356, BUN 22, creatinine 1.35, sodium 127.  Urinalysis showed WBC 11, leukocyte esterase 1+.  1.  Back cellulitis with sepsis. After admission, the patient had been treated with Zyvox. The patient's back cellulitis has much improved. No tenderness, erythema and warmness today. White count has decreased to 11.3.  2.  For urinary tract infection, the patient has been treated with Rocephin.  3.  Hyponatremia. After IV fluid with normal saline IV fluid support, the patient's hyponatremia has been improving. Sodium increased to 132.  4.  Diabetes. The patient's  blood sugar was not controlled. We resumed the patient home medication of Levemir.  The patient's blood sugar is better.  5.  Hypertension: Has been controlled with hypertension medications.  6.  Chronic kidney disease. Creatinine decreased to normal range after IV fluid support.   The patient has no complaints. Vital signs are stable. He is clinically stable and will be discharged to home today. I discussed the patient's discharge plan with the patient, nurse and case manager.   TIME SPENT: About 38 minutes.   ____________________________ Shaune PollackQing Elody Kleinsasser, MD qc:dmm D: 07/14/2013 16:15:00 ET T: 07/14/2013 19:26:58 ET JOB#: [id number]  cc: Shaune PollackQing Orman Matsumura, MD, <Dictator> Shaune PollackQING Letricia Krinsky MD ELECTRONICALLY SIGNED 07/15/2013 13:29

## 2014-11-10 NOTE — H&P (Signed)
PATIENT NAME:  Nathan Caldwell, Nathan Caldwell MR#:  [id number] DATE OF BIRTH:  Apr 21, 1951  DATE OF ADMISSION:  07/12/2013  PRIMARY CARE PHYSICIAN: [facility].   REFERRING PHYSICIAN: Dr. Marjean Donna.   CHIEF COMPLAINT: Abdominal pain, nausea, diarrhea and back cellulitis.   HISTORY OF PRESENT ILLNESS: This is a 64 year old male with significant past medical history of coronary artery disease, diabetes, morbid obesity, hyperlipidemia, with a recent hospitalization, discharged on November 20th with hospital stay significant for MRSA on the back status post incision and drainage and acute renal failure secondary to acute tubular necrosis from antibiotic. The patient presents with above-mentioned complaints. Reports he has been recently started by [facility] on Bactrim for the last 5 days due to recurrence of back cellulitis, but reports over the last day or 2, he started to feel more weak, has nausea, having some dry heaves, as well as having some mild abdominal pain and having diarrhea. Denies fever or chills at home. Reports loose watery stools which prompted him to come to the ED. In the ED, the patient was found to have significant leukocytosis at 19,000 and had significant cellulitis on the back. He had CT chest with contrast to evaluate for abscess. There was no evidence of abscess. As well, the patient's urinalysis was positive, and he was started on IV clindamycin for recurrence of his cellulitis. The patient denies any chest pain, any shortness of breath, any cough, any productive sputum, any dysuria. As well, the patient's labs were significant for mild hyponatremia at 127.   PAST MEDICAL HISTORY:  1. Diabetes.  2. Hypertension.  3. Morbid obesity.  4. Hyperlipidemia.  5. Coronary artery disease.  6. History of MRSA.    ALLERGIES: No known drug allergies.   SOCIAL HISTORY: The patient quit smoking since last admission. No alcohol. No drugs. Lives at home by himself.   FAMILY HISTORY:  Significant for hypertension, diabetes and coronary artery disease.   PAST SURGICAL HISTORY: History of I and D of back abscess during the last hospitalization.   HOME MEDICATIONS:  1. Advair 250/50 one puff b.i.d.  2. Aspirin 81 mg oral daily.  3. Bactrim 1 tablet 2 times a day for the last 5 days.  4. Isosorbide mononitrate 30 mg oral daily.  5. Gabapentin 600 mg oral 3 times a day.  6. Trazodone 50 mg oral daily.  7. Lantus 50 units at bedtime.  8. Lipitor 40 mg oral at bedtime.  9. Spiriva 18 mcg inhalational daily.  10. Amlodipine 10 mg oral daily.  11. Hydralazine 50 mg oral 4 times a day.   REVIEW OF SYSTEMS:  CONSTITUTIONAL: The patient denies fever, chills. Complains of fatigue, weakness. Denies weight gain, weight loss.  EYES: Denies blurry vision, double vision, inflammation, glaucoma.  EARS, NOSE, THROAT: Denies tinnitus, ear pain, hearing loss, epistaxis or discharge.  RESPIRATORY: Denies cough, wheezing, hemoptysis.  CARDIOVASCULAR: Denies chest pain, orthopnea, paroxysmal nocturnal dyspnea or pedal edema.  GASTROINTESTINAL: Complains of nausea, dry heaves, mild abdominal pain and diarrhea. No jaundice.  GENITOURINARY: No dysuria. No polyuria. No renal colic.  ENDOCRINE: Denies any polyuria, polydipsia, heat or cold intolerance.  HEMATOLOGY: Denies anemia, easy bruising, bleeding diathesis.  INTEGUMENTARY: Complaints of back cellulitis.  NEUROLOGIC: Denies any tingling, numbness, focal deficits, dizziness.  PSYCHIATRIC: Denies anxiety, insomnia, depression.    PHYSICAL EXAMINATION:  VITAL SIGNS: Temperature 98.5, pulse 93, respiratory rate 22, blood pressure 110/55, saturating 98% on room air.  GENERAL: Morbidly obese man, looks comfortable in bed,  in no apparent distress.  HEENT: Head atraumatic, normocephalic. Pupils equal, reactive to light. Pink conjunctivae. Anicteric sclerae. Moist oral mucosa.  NECK: Supple. No thyromegaly. No JVD.  CHEST: Good air entry  bilaterally. No wheezing, rales, rhonchi.   CARDIOVASCULAR: S1, S2 heard. No rubs, murmurs, gallops.  ABDOMEN: Soft, nontender, nondistended. Bowel sounds present. Obese. No organomegaly could be appreciated.  EXTREMITIES: No edema. No clubbing. No cyanosis. Pedal pulses +2 bilaterally.  SKIN: The patient had normal skin turgor. Warm and dry. Had back cellulitis with chest tenderness to palpation.  LYMPHATIC: No cervical lymphadenopathy could be appreciated.   PERTINENT LABORATORIES: Glucose 356, BUN 22, creatinine 1.35, sodium 127, potassium 4.3, chloride 93, CO2 25. ALT 28, AST 18, alk phos 99, total bili 0.3, hemoglobin 11.2, hematocrit 35.4, platelets 395. Urinalysis: White blood cells 11, leukocyte esterase +1.   CT thoracic spine with contrast showing subcutaneous stranding in the upper back eccentric to the left. No abscess.   ASSESSMENT AND PLAN:  1. Sepsis: The patient presents with significant leukocytosis, tachycardia and tachypnea as well. Source might be related due to his cellulitis and as well due to urinary tract infection. At this point, we need to rule out Clostridium difficile. The patient was cultured, and he was started on intravenous antibiotics.  2. Nausea and vomiting: This is most likely due to Bactrim, but given the fact the patient was on a lengthy course of antibiotic, we will check Clostridium difficile as well. Will continue with hydration, p.r.n. nausea and pain medicine. Abdomen exam is benign.  3. Back cellulitis: The patient is known to have history of methicillin-resistant Staphylococcus aureus. Vancomycin caused him to have acute tubular necrosis last admission, so we will have him on Zyvox. CT chest does not show any abscess.  4. Urinary tract infection: The patient will be started on Rocephin.  5. Hyponatremia: This is most likely related to his nausea. Will have him on intravenous normal saline.  6. Diabetes mellitus, uncontrolled: The patient will be resumed  back on Lantus. Will lower the dose from 50 to 40 and will have him on insulin sliding scale.  7. Hyperlipidemia: Continue with statin.  8. History of coronary artery disease: The patient denies any chest pain, any shortness of breath. He is on aspirin, beta blockers, statin, Imdur. He is not on any ACE inhibitors due to his history of recent acute renal failure.  9. Deep vein thrombosis prophylaxis: Subcutaneous heparin.   CODE STATUS: FULL CODE.   TOTAL TIME SPENT ON ADMISSION AND PATIENT CARE: 55 minutes.   ____________________________ Albertine Patricia, MD dse:gb D: 07/12/2013 02:58:35 ET T: 07/12/2013 04:04:45 ET JOB#: [id number]  cc: Albertine Patricia, MD, <Dictator> Balbina Depace Graciela Husbands MD ELECTRONICALLY SIGNED 07/13/2013 5:52

## 2014-11-10 NOTE — Discharge Summary (Signed)
PATIENT NAME:  Nathan GlaserICKARD, Daaiel MR#:  [id number] DATE OF BIRTH:  Jul 02, 1951  DATE OF ADMISSION:  05/23/2013  DATE OF DISCHARGE:  06/09/2013  Addendum to previously-dictated discharge summary by Dr. Sherryll BurgerShah on November 18: The patient was waiting for placement for mainly his dressing changes on his back, where he has abscess, and incision and drainage performed. After discussion with surgery, Dr. Michela PitcherEly, it was felt that his Penrose drain can be removed, which was done on 20th of November by Dr. Michela PitcherEly. I also requested a wound care consult, and they were able to coordinate with home health nurse to see him at least every 5 to 7 days for dressing changes at home, and this was set up by care management/social worker. He is being discharged home in stable condition. The rest of the information remains the same in the previously-dictated discharge summary, including the medication list. No new changes have been made. Total time taking care of this patient was 55 minutes.   The patient did get last set of labs today, with a creatinine of 3.1, which keeps showing improvement from previous functions. He will need a basic metabolic panel check on the 24th of November, with results forwarded to his primary care physician and Dr. Mosetta PigeonHarmeet Singh for renal function monitoring along with sodium monitoring. His sodium was somewhat low. Total time discharging this patient was 55 minutes.    ____________________________ Ellamae SiaVipul S. Sherryll BurgerShah, MD vss:mr D: 06/09/2013 16:28:03 ET T: 06/09/2013 20:26:58 ET JOB#: [id number]  cc: Cesar Alf S. Sherryll BurgerShah, MD, <Dictator> [facility] Quentin Orealph L. Ely III, MD Mosetta PigeonHarmeet Singh, MD    Ellamae SiaVIPUL S [facility] Services[facility] MD ELECTRONICALLY SIGNED 06/13/2013 5:48

## 2014-11-11 NOTE — Discharge Summary (Signed)
PATIENT NAME:  Nathan Caldwell, Nathan Caldwell MR#:  [id number] DATE OF BIRTH:  [birth year]  DATE OF ADMISSION:  12/25/2013 DATE OF DISCHARGE:  12/30/2013  PRESENTING COMPLAINT: Abscess on the back.   DISCHARGE DIAGNOSES:  1. 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  2. Type 2 diabetes.  3. Hypertension.  4. Morbid obesity. 5. Medical noncompliance.   PROCEDURES: Incision and drainage, back abscess, on June 11th.   CODE STATUS: Full code.   MEDICATIONS:  1. Trazodone 50 mg at bedtime.  2. Insulin detemir 50 units subcutaneous b.i.d.  3. Lipitor 40 mg daily at bedtime.  4. Imdur 30 mg extended release p.o. daily.  5. Gabapentin 600 mg p.o. t.i.d.  6. Toprol-XL 50 mg daily.  7. Advair Diskus 250/50 one puff b.i.d.  8. Spiriva 18 mcg inhalation daily.  9. Amlodipine 10 mg daily.  10. Bactrim 1 tablet b.i.d.  11. Acetaminophen/oxycodone 325/5 one tablet p.o. every 6 hours as needed.   DISCHARGE INSTRUCTIONS: Home health RN and social worker to follow. Back dressing changes with calcium alginate 3 times a week.   FOLLOWUP: With Dr. Egbert GaribaldiBird in 1 to 2 weeks.   LABORATORY DATA: White count on discharge 10.6.   SURGICAL CONSULTATION: Dr. Egbert GaribaldiBird.   BRIEF SUMMARY OF HOSPITAL COURSE: Nathan Caldwell is a 64 year old obese gentleman with history of hypertension, diabetes, diabetic neuropathy, history of MRSA infection in the back secondary to abscess, who came in due to sepsis suspected from back recurrent abscess. He was admitted with:   1. Sepsis. Presented with tachycardia, leukocytosis, subjective fever, likely due to recurrent abscess in the back. He was started initially on IV Zyvox and Rocephin; however, changed to p.o. Bactrim. THE PATIENT IS NOT ALLERGIC TO BACTRIM, VERIFIED WITH HIM. He is status post I and D just performed by Dr. Egbert GaribaldiBird. No fever. White count was normal.  2. Back abscess, recurrent, with history of previous MRSA, needed to be drained in the  past as well. The patient will finish up course of Bactrim. Home health RN will help with dressing changes.  3. Leukocytosis, trending down.  4. Type 2 diabetes, on Levemir.  5. Chronic obstructive pulmonary disease, no acute exacerbation. Continue Spiriva and Advair. 6. Diabetic neuropathy, on Neurontin.  7. Hypertension. Continue Norvasc, hydralazine and Imdur.  8. Hospital stay otherwise remained stable.   CODE STATUS: The patient remained a full code.   TIME SPENT: 40 minutes.   ____________________________ Wylie HailSona A. Allena KatzPatel, MD sap:lb D: 12/31/2013 06:50:57 ET T: 12/31/2013 10:16:44 ET JOB#: [id number]  cc: Kveon Casanas A. Allena KatzPatel, MD, <Dictator> Willow OraSONA A Delante Karapetyan MD ELECTRONICALLY SIGNED 01/18/2014 13:36

## 2014-11-11 NOTE — H&P (Signed)
PATIENT NAME:  Nathan Caldwell, Nathan Caldwell MR#:  [id number] DATE OF BIRTH:  02/02/1951  DATE OF ADMISSION:  12/25/2013  REFERRING PHYSICIAN: Dr. Shaune PollackLord.   PRIMARY CARE PHYSICIAN: [facility].  CHIEF COMPLAINT: Abscess on back.  HISTORY OF PRESENT ILLNESS: A 64 year old Caucasian gentleman with a history of diabetes, hypertension, hyperlipidemia, coronary artery disease, presenting with an abscess on his back, describes 1-week duration of noticing a lesion on his back, tenderness to palpation with associated erythema and edema. Denies any discharge or drainage. Denies any fevers or chills. Does have a history of MRSA at this same lesion. No further symptomatology.   REVIEW OF SYSTEMS:  CONSTITUTIONAL: Denies fever. However, positive for fatigue, weakness. EYES: Denies blurry, double vision, or eye pain.  HEENT: Denies tinnitus, ear pain, hearing loss.  RESPIRATORY: Denies cough, wheeze, shortness of breath. CARDIOVASCULAR: Denies chest pain, palpitations, edema.  GASTROINTESTINAL: Denies nausea, vomiting, diarrhea, or abdominal pain.  GENITOURINARY: Denies dysuria or hematuria.  ENDOCRINE: Denies nocturia or thyroid problems.  HEMATOLOGIC AND LYMPHATIC: Denies easy bruising, bleeding. SKIN: Denies rash or lesions. MUSCULOSKELETAL: Denies pain in neck, back, shoulders, knees, hips.   NEUROLOGIC: Denies paralysis or paresthesias.  PSYCHIATRIC: Denies anxiety or depressive symptoms.   Otherwise, full review of systems performed by me is negative.   PAST MEDICAL HISTORY: Diabetes, hypertension, hyperlipidemia, coronary artery disease.   SOCIAL HISTORY: Positive for tobacco usage. Denies any alcohol or drug usage.   FAMILY HISTORY: Positive for hypertension, diabetes, coronary artery disease.  ALLERGIES: CINNAMON AND BEE STINGS.   HOME MEDICATIONS: Include aspirin 81 mg p.o. daily, Imdur 30 mg p.o. daily, gabapentin 600 mg p.o. 3 times daily, trazodone 50 mg p.o. daily, Levemir 50 units b.i.d.,  Lipitor 40 mg p.o. daily, Toprol-XL 50 mg p.o. daily, Advair 250/50 mcg inhalation b.i.d., Spiriva 18 mcg inhalation once daily, Norvasc 10 mg p.o. daily, hydralazine 50 mg p.o. 4 times a daily,  PHYSICAL EXAMINATION:   VITAL SIGNS: Temperature 98.4, heart rate 110, respirations 20, blood pressure 178/112, weight 136.1 kg, BMI of 40.7.  GENERAL: Well-nourished, well-developed Caucasian gentleman currently in no distress.  HEAD: Normocephalic, atraumatic.  EYES: Pupils equal, round, reactive to light. Extraocular muscles intact. No scleral icterus.  MOUTH: Moist mucous membranes. Dentition intact. No abscess noted.  EARS, NOSE, AND THROAT: Clear without exudates. No external lesions.  NECK: Supple. No thyromegaly. No nodules. No JVD.  PULMONARY: Clear to auscultation bilaterally without wheezes, rubs, or rhonchi. No use of accessory muscles. Good respiratory effort.  CHEST:  Nontender on palpation.  CARDIOVASCULAR: S1, S2, regular rate and rhythm. No murmurs, rubs, or gallops. No edema. Pedal pulses 2+ bilaterally.  GASTROINTESTINAL: Soft, nontender, nondistended. No masses. Positive bowel sounds. No hepatosplenomegaly.  MUSCULOSKELETAL: Left ankle swelling, tenderness to palpation, without surrounding erythema or ecchymosis. Range of motion full in all extremities. No clubbing, no edema.  NEUROLOGIC: Cranial nerves II-XII intact. No gross neurologic deficits. Sensation intact. Reflexes intact.  SKIN: No ulcerations, lesions, no rashes, or cyanosis. Skin warm and dry. Turgor intact. PSYCHIATRIC: Mood and affect within normal limits. The patient alert and oriented x 3. Insight and judgment intact.   LABORATORY DATA: Sodium 133, potassium 4.1, chloride 100, bicarbonate 25, BUN 12, creatinine 1.16, glucose 290. LFTs: Albumin 3.3, otherwise within normal limits. WBC 17.9, hemoglobin 15.3, platelets 328,000. Urinalysis: WBC 7, RBCs 2, leukocyte esterase trace, epithelial cells 1.  X-ray of the left  ankle reveals no acute findings.   ASSESSMENT AND PLAN: A 64 year old gentleman with a history of  diabetes, hypertension, hyperlipidemia, presented with abscess on his back. 1. Sepsis meeting septic criteria by heart rate, leukocytosis, blood and urine cultures. IV fluid hydration. Keep mean arterial pressure greater than 65 and continue antibiotic coverage with Zyvox as initiated in the Emergency Department as well as add ceftriaxone until the culture data returns. Follow culture data and titrate antibiotics from that point.  2. Diabetes. Continue with basal insulin as well as add insulin sliding scale with q. 6 hours Accu-Cheks. 3. Hypertension, continue with Norvasc. 4. Hyperlipidemia, continue with statins.  5. Venous thromboembolism prophylaxis with SCDs.  CODE STATUS: The patient is a full code.  TIME SPENT: 45 minutes.     ____________________________ Cletis Athens. Berdie Malter, MD dkh:lt D: 12/25/2013 23:18:00 ET T: 12/26/2013 02:21:31 ET JOB#: [id number]  cc: Cletis Athens. Kiyana Vazguez, MD, <Dictator> Althea Backs Synetta Shadow MD ELECTRONICALLY SIGNED 12/26/2013 21:49

## 2014-11-11 NOTE — Op Note (Signed)
PATIENT NAME:  Nathan Caldwell, Nathan Caldwell MR#:  [id number] DATE OF BIRTH:  12/19/1950  DATE OF PROCEDURE:  12/29/2013   PREOPERATIVE DIAGNOSIS: Midback abscess.   POSTOPERATIVE DIAGNOSIS:  Midback abscess.  PROCEDURE PERFORMED:  Incision and drainage of the midback abscess.   SURGEON:  Natale LayMark Haset [place], M.D.   ASSISTANT:  None.   ANESTHESIA:  MAC with local.   FINDINGS:  Pus.   SPECIMENS:  Pus.   DESCRIPTION OF PROCEDURE:  With informed consent, the patient was brought to the operating room and positioned supine. Percutaneous monitoring lines were placed. With the help of a beanbag, he was placed in the left lateral decubitus position. The abscess cavity was evident on his midback. Hair around the region was clipped. There area was prepped and draped with Betadine solution.   Timeout was observed.   Incision overlying the area of maximal purulence was performed with scalpel and electrocautery, and an aliquot of pus was sent for microbacteriological analysis. The wound did not tunnel. It was irrigated. Hemostasis was obtained. It was then packed open with Betadine-soaked gauze. Sterile dressing was applied. The patient was then returned supine and taken to the recovery room in stable and satisfactory condition.      ____________________________ Redge GainerMark A. Egbert GaribaldiBird, MD mab:dmm D: 12/29/2013 16:56:39 ET T: 12/29/2013 18:35:31 ET JOB#: [id number]  cc: Loraine LericheMark A. Egbert GaribaldiBird, MD, <Dictator> Raynald KempMARK A Wilmarie Sparlin MD ELECTRONICALLY SIGNED 01/01/2014 11:50

## 2014-12-05 ENCOUNTER — Emergency Department: Payer: Medicaid Other

## 2014-12-05 ENCOUNTER — Encounter: Payer: Self-pay | Admitting: *Deleted

## 2014-12-05 ENCOUNTER — Inpatient Hospital Stay
Admission: EM | Admit: 2014-12-05 | Discharge: 2014-12-11 | DRG: 872 | Disposition: A | Discharge status: Skilled Nursing Facility | Payer: Medicaid Other | Attending: Internal Medicine | Admitting: Internal Medicine

## 2014-12-05 DIAGNOSIS — I10 Essential (primary) hypertension: Secondary | ICD-10-CM | POA: Diagnosis present

## 2014-12-05 DIAGNOSIS — E1140 Type 2 diabetes mellitus with diabetic neuropathy, unspecified: Secondary | ICD-10-CM | POA: Diagnosis present

## 2014-12-05 DIAGNOSIS — A419 Sepsis, unspecified organism: Principal | ICD-10-CM | POA: Diagnosis present

## 2014-12-05 DIAGNOSIS — F4321 Adjustment disorder with depressed mood: Secondary | ICD-10-CM | POA: Diagnosis present

## 2014-12-05 DIAGNOSIS — I482 Chronic atrial fibrillation: Secondary | ICD-10-CM | POA: Diagnosis present

## 2014-12-05 DIAGNOSIS — B961 Klebsiella pneumoniae [K. pneumoniae] as the cause of diseases classified elsewhere: Secondary | ICD-10-CM | POA: Diagnosis present

## 2014-12-05 DIAGNOSIS — L89310 Pressure ulcer of right buttock, unstageable: Secondary | ICD-10-CM | POA: Diagnosis present

## 2014-12-05 DIAGNOSIS — F17210 Nicotine dependence, cigarettes, uncomplicated: Secondary | ICD-10-CM | POA: Diagnosis present

## 2014-12-05 DIAGNOSIS — E1149 Type 2 diabetes mellitus with other diabetic neurological complication: Secondary | ICD-10-CM

## 2014-12-05 DIAGNOSIS — I509 Heart failure, unspecified: Secondary | ICD-10-CM | POA: Diagnosis present

## 2014-12-05 DIAGNOSIS — B9620 Unspecified Escherichia coli [E. coli] as the cause of diseases classified elsewhere: Secondary | ICD-10-CM | POA: Diagnosis present

## 2014-12-05 DIAGNOSIS — I340 Nonrheumatic mitral (valve) insufficiency: Secondary | ICD-10-CM | POA: Diagnosis present

## 2014-12-05 DIAGNOSIS — E119 Type 2 diabetes mellitus without complications: Secondary | ICD-10-CM

## 2014-12-05 DIAGNOSIS — F54 Psychological and behavioral factors associated with disorders or diseases classified elsewhere: Secondary | ICD-10-CM

## 2014-12-05 DIAGNOSIS — I252 Old myocardial infarction: Secondary | ICD-10-CM

## 2014-12-05 DIAGNOSIS — R6510 Systemic inflammatory response syndrome (SIRS) of non-infectious origin without acute organ dysfunction: Secondary | ICD-10-CM

## 2014-12-05 DIAGNOSIS — L8990 Pressure ulcer of unspecified site, unspecified stage: Secondary | ICD-10-CM

## 2014-12-05 DIAGNOSIS — Z8673 Personal history of transient ischemic attack (TIA), and cerebral infarction without residual deficits: Secondary | ICD-10-CM

## 2014-12-05 DIAGNOSIS — L89320 Pressure ulcer of left buttock, unstageable: Secondary | ICD-10-CM | POA: Diagnosis present

## 2014-12-05 DIAGNOSIS — E1121 Type 2 diabetes mellitus with diabetic nephropathy: Secondary | ICD-10-CM | POA: Diagnosis present

## 2014-12-05 DIAGNOSIS — B952 Enterococcus as the cause of diseases classified elsewhere: Secondary | ICD-10-CM | POA: Diagnosis present

## 2014-12-05 DIAGNOSIS — Z9119 Patient's noncompliance with other medical treatment and regimen: Secondary | ICD-10-CM | POA: Diagnosis present

## 2014-12-05 DIAGNOSIS — B9562 Methicillin resistant Staphylococcus aureus infection as the cause of diseases classified elsewhere: Secondary | ICD-10-CM | POA: Diagnosis present

## 2014-12-05 DIAGNOSIS — Z22322 Carrier or suspected carrier of Methicillin resistant Staphylococcus aureus: Secondary | ICD-10-CM

## 2014-12-05 DIAGNOSIS — E1165 Type 2 diabetes mellitus with hyperglycemia: Secondary | ICD-10-CM | POA: Diagnosis present

## 2014-12-05 DIAGNOSIS — Z993 Dependence on wheelchair: Secondary | ICD-10-CM

## 2014-12-05 DIAGNOSIS — Z590 Homelessness: Secondary | ICD-10-CM

## 2014-12-05 DIAGNOSIS — Z9102 Food additives allergy status: Secondary | ICD-10-CM | POA: Diagnosis not present

## 2014-12-05 DIAGNOSIS — Z91030 Bee allergy status: Secondary | ICD-10-CM

## 2014-12-05 DIAGNOSIS — Z794 Long term (current) use of insulin: Secondary | ICD-10-CM

## 2014-12-05 DIAGNOSIS — E871 Hypo-osmolality and hyponatremia: Secondary | ICD-10-CM | POA: Diagnosis present

## 2014-12-05 HISTORY — DX: Carrier or suspected carrier of methicillin resistant Staphylococcus aureus: Z22.322

## 2014-12-05 HISTORY — DX: Heart failure, unspecified: I50.9

## 2014-12-05 HISTORY — DX: Nonrheumatic mitral (valve) insufficiency: I34.0

## 2014-12-05 HISTORY — DX: Essential (primary) hypertension: I10

## 2014-12-05 HISTORY — DX: Type 2 diabetes mellitus with diabetic nephropathy: E11.21

## 2014-12-05 HISTORY — DX: Type 2 diabetes mellitus without complications: E11.9

## 2014-12-05 HISTORY — DX: Transient cerebral ischemic attack, unspecified: G45.9

## 2014-12-05 LAB — URINALYSIS COMPLETE WITH MICROSCOPIC (ARMC ONLY)
BILIRUBIN URINE: NEGATIVE
Nitrite: NEGATIVE
Protein, ur: NEGATIVE mg/dL
SQUAMOUS EPITHELIAL / LPF: NONE SEEN
Specific Gravity, Urine: 1.032 — ABNORMAL HIGH (ref 1.005–1.030)
pH: 5 (ref 5.0–8.0)

## 2014-12-05 LAB — BLOOD GAS, VENOUS
ACID-BASE EXCESS: 2.8 mmol/L (ref 0.0–3.0)
BICARBONATE: 29.4 meq/L — AB (ref 21.0–28.0)
FIO2: 21 %
PCO2 VEN: 52 mmHg (ref 44.0–60.0)
PH VEN: 7.36 (ref 7.320–7.430)
Patient temperature: 37

## 2014-12-05 LAB — CBC
HCT: 39 % — ABNORMAL LOW (ref 40.0–52.0)
HEMOGLOBIN: 13.2 g/dL (ref 13.0–18.0)
MCH: 31.4 pg (ref 26.0–34.0)
MCHC: 33.7 g/dL (ref 32.0–36.0)
MCV: 93 fL (ref 80.0–100.0)
Platelets: 238 10*3/uL (ref 150–440)
RBC: 4.2 MIL/uL — ABNORMAL LOW (ref 4.40–5.90)
RDW: 13 % (ref 11.5–14.5)
WBC: 18.4 10*3/uL — AB (ref 3.8–10.6)

## 2014-12-05 LAB — CBC WITH DIFFERENTIAL/PLATELET
Basophils Absolute: 0.1 10*3/uL (ref 0–0.1)
Basophils Relative: 0 %
EOS PCT: 0 %
Eosinophils Absolute: 0 10*3/uL (ref 0–0.7)
HEMATOCRIT: 41 % (ref 40.0–52.0)
Hemoglobin: 13.8 g/dL (ref 13.0–18.0)
LYMPHS ABS: 1.5 10*3/uL (ref 1.0–3.6)
LYMPHS PCT: 7 %
MCH: 31.4 pg (ref 26.0–34.0)
MCHC: 33.7 g/dL (ref 32.0–36.0)
MCV: 93.1 fL (ref 80.0–100.0)
MONOS PCT: 8 %
Monocytes Absolute: 1.6 10*3/uL — ABNORMAL HIGH (ref 0.2–1.0)
Neutro Abs: 17.8 10*3/uL — ABNORMAL HIGH (ref 1.4–6.5)
Neutrophils Relative %: 85 %
Platelets: 248 10*3/uL (ref 150–440)
RBC: 4.41 MIL/uL (ref 4.40–5.90)
RDW: 12.9 % (ref 11.5–14.5)
WBC: 21 10*3/uL — ABNORMAL HIGH (ref 3.8–10.6)

## 2014-12-05 LAB — COMPREHENSIVE METABOLIC PANEL
ALT: 11 U/L — ABNORMAL LOW (ref 17–63)
AST: 16 U/L (ref 15–41)
Albumin: 2.6 g/dL — ABNORMAL LOW (ref 3.5–5.0)
Alkaline Phosphatase: 63 U/L (ref 38–126)
Anion gap: 10 (ref 5–15)
BUN: 13 mg/dL (ref 6–20)
CALCIUM: 8.2 mg/dL — AB (ref 8.9–10.3)
CO2: 26 mmol/L (ref 22–32)
Chloride: 93 mmol/L — ABNORMAL LOW (ref 101–111)
Creatinine, Ser: 0.86 mg/dL (ref 0.61–1.24)
GLUCOSE: 307 mg/dL — AB (ref 65–99)
Potassium: 3.6 mmol/L (ref 3.5–5.1)
Sodium: 129 mmol/L — ABNORMAL LOW (ref 135–145)
TOTAL PROTEIN: 6.2 g/dL — AB (ref 6.5–8.1)
Total Bilirubin: 0.9 mg/dL (ref 0.3–1.2)

## 2014-12-05 LAB — URINE DRUG SCREEN, QUALITATIVE (ARMC ONLY)
Amphetamines, Ur Screen: NOT DETECTED
BARBITURATES, UR SCREEN: NOT DETECTED
Benzodiazepine, Ur Scrn: NOT DETECTED
COCAINE METABOLITE, UR ~~LOC~~: NOT DETECTED
Cannabinoid 50 Ng, Ur ~~LOC~~: NOT DETECTED
MDMA (ECSTASY) UR SCREEN: NOT DETECTED
Methadone Scn, Ur: NOT DETECTED
OPIATE, UR SCREEN: NOT DETECTED
Phencyclidine (PCP) Ur S: NOT DETECTED
Tricyclic, Ur Screen: NOT DETECTED

## 2014-12-05 LAB — ETHANOL: Alcohol, Ethyl (B): <5 mg/dL (ref <5)

## 2014-12-05 LAB — GLUCOSE, CAPILLARY: GLUCOSE-CAPILLARY: 262 mg/dL — AB (ref 65–99)

## 2014-12-05 LAB — TROPONIN I: TROPONIN I: 0.03 ng/mL (ref <0.031)

## 2014-12-05 LAB — LACTIC ACID, PLASMA: LACTIC ACID, VENOUS: 2 mmol/L (ref 0.5–2.0)

## 2014-12-05 MED ORDER — SODIUM CHLORIDE 0.9 % IV SOLN
Freq: Once | INTRAVENOUS | Status: AC
  Administered 2014-12-05: 23:00:00 via INTRAVENOUS

## 2014-12-05 MED ORDER — INSULIN ASPART 100 UNIT/ML ~~LOC~~ SOLN
0.0000 [IU] | Freq: Three times a day (TID) | SUBCUTANEOUS | Status: DC
  Administered 2014-12-06: 3 [IU] via SUBCUTANEOUS
  Administered 2014-12-06: 5 [IU] via SUBCUTANEOUS
  Filled 2014-12-05: qty 3
  Filled 2014-12-05: qty 5

## 2014-12-05 MED ORDER — SODIUM CHLORIDE 0.9 % IV SOLN: INTRAVENOUS | Status: DC

## 2014-12-05 MED ORDER — NICOTINE 21 MG/24HR TD PT24
21.0000 mg | MEDICATED_PATCH | Freq: Every day | TRANSDERMAL | Status: DC
  Administered 2014-12-11: 11:00:00 21 mg via TRANSDERMAL
  Filled 2014-12-05 (×3): qty 1

## 2014-12-05 MED ORDER — ONDANSETRON HCL 4 MG PO TABS: 4.0000 mg | ORAL_TABLET | Freq: Four times a day (QID) | ORAL | Status: DC | PRN

## 2014-12-05 MED ORDER — VANCOMYCIN HCL 10 G IV SOLR
1250.0000 mg | Freq: Three times a day (TID) | INTRAVENOUS | Status: DC
  Administered 2014-12-06 – 2014-12-07 (×5): 1250 mg via INTRAVENOUS
  Filled 2014-12-05 (×8): qty 1250

## 2014-12-05 MED ORDER — METOPROLOL TARTRATE 25 MG PO TABS
25.0000 mg | ORAL_TABLET | Freq: Two times a day (BID) | ORAL | Status: DC
  Administered 2014-12-05 – 2014-12-11 (×12): 25 mg via ORAL
  Filled 2014-12-05 (×12): qty 1

## 2014-12-05 MED ORDER — HYDROCODONE-ACETAMINOPHEN 5-325 MG PO TABS
1.0000 | ORAL_TABLET | Freq: Four times a day (QID) | ORAL | Status: DC | PRN
  Administered 2014-12-06 – 2014-12-07 (×4): 1 via ORAL
  Administered 2014-12-08 (×2): 2 via ORAL
  Administered 2014-12-08: 1 via ORAL
  Administered 2014-12-09 – 2014-12-10 (×6): 2 via ORAL
  Filled 2014-12-05: qty 2
  Filled 2014-12-05: qty 1
  Filled 2014-12-05 (×3): qty 2
  Filled 2014-12-05 (×2): qty 1
  Filled 2014-12-05: qty 2
  Filled 2014-12-05 (×2): qty 1
  Filled 2014-12-05 (×3): qty 2

## 2014-12-05 MED ORDER — SODIUM CHLORIDE 0.9 % IV BOLUS (SEPSIS)
1000.0000 mL | Freq: Once | INTRAVENOUS | Status: AC
  Administered 2014-12-05: 1000 mL via INTRAVENOUS

## 2014-12-05 MED ORDER — ONDANSETRON HCL 4 MG/2ML IJ SOLN: 4.0000 mg | Freq: Four times a day (QID) | INTRAMUSCULAR | Status: DC | PRN

## 2014-12-05 MED ORDER — ALUM & MAG HYDROXIDE-SIMETH 200-200-20 MG/5ML PO SUSP: 30.0000 mL | Freq: Four times a day (QID) | ORAL | Status: DC | PRN

## 2014-12-05 MED ORDER — ASPIRIN EC 81 MG PO TBEC
81.0000 mg | DELAYED_RELEASE_TABLET | Freq: Every day | ORAL | Status: DC
  Administered 2014-12-06 – 2014-12-11 (×6): 81 mg via ORAL
  Filled 2014-12-05 (×6): qty 1

## 2014-12-05 MED ORDER — DOCUSATE SODIUM 100 MG PO CAPS
100.0000 mg | ORAL_CAPSULE | Freq: Every day | ORAL | Status: DC | PRN
  Filled 2014-12-05: qty 1

## 2014-12-05 MED ORDER — VANCOMYCIN HCL IN DEXTROSE 1-5 GM/200ML-% IV SOLN
1000.0000 mg | Freq: Once | INTRAVENOUS | Status: AC
  Administered 2014-12-05: 1000 mg via INTRAVENOUS

## 2014-12-05 MED ORDER — VANCOMYCIN HCL IN DEXTROSE 1-5 GM/200ML-% IV SOLN
INTRAVENOUS | Status: AC
  Administered 2014-12-05: 1000 mg via INTRAVENOUS
  Filled 2014-12-05: qty 200

## 2014-12-05 MED ORDER — ACETAMINOPHEN 650 MG RE SUPP: 650.0000 mg | Freq: Four times a day (QID) | RECTAL | Status: DC | PRN

## 2014-12-05 MED ORDER — HEPARIN SODIUM (PORCINE) 5000 UNIT/ML IJ SOLN
5000.0000 [IU] | Freq: Three times a day (TID) | INTRAMUSCULAR | Status: DC
  Filled 2014-12-05 (×5): qty 1

## 2014-12-05 MED ORDER — ACETAMINOPHEN 325 MG PO TABS
650.0000 mg | ORAL_TABLET | Freq: Four times a day (QID) | ORAL | Status: DC | PRN
  Administered 2014-12-07: 650 mg via ORAL
  Filled 2014-12-05: qty 2

## 2014-12-05 NOTE — Progress Notes (Signed)
ANTIBIOTIC CONSULT NOTE - INITIAL  Pharmacy Consult for vancomycin dosing  Indication: sepsis  Allergies  Allergen Reactions  . Bee Venom   . Cinnamon     Patient Measurements: Height: 6\' 3"  (190.5 cm) Weight: 273 lb 13 oz (124.201 kg) IBW/kg (Calculated) : 84.5 Adjusted Body Weight: 100 kg  Vital Signs: Temp: 98.9 F (37.2 C) (05/17 2224) Temp Source: Oral (05/17 2224) BP: 107/46 mmHg (05/17 2224) Pulse Rate: 100 (05/17 2224) Intake/Output from previous day:   Intake/Output from this shift:    Labs:  Recent Labs  12/05/14 1636  WBC 21.0*  HGB 13.8  PLT 248  CREATININE 0.86   Estimated Creatinine Clearance: 123.2 mL/min (by C-G formula based on Cr of 0.86). No results for input(s): VANCOTROUGH, VANCOPEAK, VANCORANDOM, GENTTROUGH, GENTPEAK, GENTRANDOM, TOBRATROUGH, TOBRAPEAK, TOBRARND, AMIKACINPEAK, AMIKACINTROU, AMIKACIN in the last 72 hours.   Microbiology: No results found for this or any previous visit (from the past 720 hour(s)).  Medical History: Past Medical History  Diagnosis Date  . Hypertension   . Diabetes mellitus without complication   . CHF (congestive heart failure)   . MRSA (methicillin resistant staph aureus) culture positive     1 year ago  . 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   . Diabetic nephropathy   . TIA (transient ischemic attack)     Medications:   Assessment:   Goal of Therapy:  Vancomycin trough level 15-20 mcg/ml  Plan:  Blood and urine cx pending UA: LE(tr) NO2(-) WBC 0-5 CXR: no acute disease TBW 124.2kg  IBW 84.5kg  DW 100kg Vd 70L kei 0.1 hr-1  t1/2 7 hours 1 gram given in ED, not candidate for stacked dosing.. 1250 mg q 8 hours ordered as continuation of therapy. Trough at 03:30, 5/18 before 5th dose.  Lonni Dirden S 12/05/2014,11:24 PM

## 2014-12-05 NOTE — ED Provider Notes (Signed)
-----------------------------------------   4:07 PM on 12/05/2014 -----------------------------------------  Assumed care from Dr. Mindi JunkerGottlieb. Briefly this is a 64 year old male who presented to the emergency department with complaint of weakness. States he has not been on his diabetic or hypertensive medication for 6 weeks. Awaiting further workup.  EKG Time: 1557 Rate: 115 Rhythm: Sinus tachycardia Axis: Left axis deviation Intervals: QTc 467 QRS: Q waves in V1 ST changes: No ST elevation  Chest x-ray  IMPRESSION: No active disease.  ----------------------------------------- 7:08 PM on 12/05/2014 -----------------------------------------  Labs Reviewed  COMPREHENSIVE METABOLIC PANEL - Abnormal; Notable for the following:    Sodium 129 (*)    Chloride 93 (*)    Glucose, Bld 307 (*)    Calcium 8.2 (*)    Total Protein 6.2 (*)    Albumin 2.6 (*)    ALT 11 (*)    All other components within normal limits  CBC WITH DIFFERENTIAL/PLATELET - Abnormal; Notable for the following:    WBC 21.0 (*)    Neutro Abs 17.8 (*)    Monocytes Absolute 1.6 (*)    All other components within normal limits  URINALYSIS COMPLETEWITH MICROSCOPIC ([facility])  - Abnormal; Notable for the following:    Color, Urine YELLOW (*)    APPearance CLEAR (*)    Glucose, UA >500 (*)    Ketones, ur 1+ (*)    Specific Gravity, Urine 1.032 (*)    Hgb urine dipstick 2+ (*)    Leukocytes, UA TRACE (*)    Bacteria, UA RARE (*)    All other components within normal limits  BLOOD GAS, VENOUS - Abnormal; Notable for the following:    Bicarbonate 29.4 (*)    All other components within normal limits  GLUCOSE, CAPILLARY - Abnormal; Notable for the following:    Glucose-Capillary 262 (*)    All other components within normal limits  ETHANOL  TROPONIN I  URINE DRUG SCREEN, QUALITATIVE ([facility])  LACTIC ACID, PLASMA  LACTIC ACID, PLASMA    Patient has a high leukocytosis. At this point I'm concerned patient meets  SIRS criteria we will plan on giving antibiotics, continuing fluids and admitted to the hospital for further workup and management.  Phineas SemenGraydon Kathryne Ramella, MD 12/05/14 [id number]

## 2014-12-05 NOTE — H&P (Signed)
[facility] - [place] at [facility]   PATIENT NAME: Nathan Caldwell    MR#:  [id number]  DATE OF BIRTH:  1950/11/13  DATE OF ADMISSION:  12/05/2014  PRIMARY CARE PHYSICIAN: No PCP Per Patient   REQUESTING/REFERRING PHYSICIAN: Dr. Derrill Kay  CHIEF COMPLAINT:  Generalized weakness  HISTORY OF PRESENT ILLNESS:  Heinrich Fertig  is a 64 y.o. male with a known history of congestive heart failure, diabetes mellitus, hypertension and chronic atrial fibrillation not on any anticoagulation is presenting to the ED with a chief complaint of generalized weakness. Patient is reporting that he is not taking any of his diabetes hypertensive medications for the past 6 weeks. Patient has been homeless and living outside for the past 6 days. Patient is complaining of pain and numbness in both his feet and in the bilateral sacral area. Denies any chest pain or shortness of breath. No other complaints. No family members at bedside.  PAST MEDICAL HISTORY:   Past Medical History  Diagnosis Date  . Hypertension   . Diabetes mellitus without complication   . CHF (congestive heart failure)   . MRSA (methicillin resistant staph aureus) culture positive     1 year ago  . 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   . Diabetic nephropathy   . TIA (transient ischemic attack)     PAST SURGICAL HISTOIRY:   Past Surgical History  Procedure Laterality Date  . Appendectomy      SOCIAL HISTORY:   History  Substance Use Topics  . Smoking status: Current Every Day Smoker -- 1.00 packs/day  . Smokeless tobacco: Not on file  . Alcohol Use: No    FAMILY HISTORY:  History reviewed. No pertinent family history.  DRUG ALLERGIES:   Allergies  Allergen Reactions  . Bee Venom   . Cinnamon     REVIEW OF SYSTEMS:  CONSTITUTIONAL: No fever. Complaining of fatigue and weakness.  EYES: No blurred or double vision.  EARS, NOSE, AND THROAT: No tinnitus or ear pain.  RESPIRATORY: No  cough, shortness of breath, wheezing or hemoptysis.  CARDIOVASCULAR: No chest pain, orthopnea, edema.  GASTROINTESTINAL: No nausea, vomiting, diarrhea or abdominal pain.  GENITOURINARY: No dysuria, hematuria.  ENDOCRINE: No polyuria, nocturia,  HEMATOLOGY: No anemia, easy bruising or bleeding SKIN: Has sacral decubiti ulcers on bilateral gluteal areas  MUSCULOSKELETAL: No joint pain or arthritis.   NEUROLOGIC: No tingling, numbness, weakness.  PSYCHIATRY: No anxiety or depression.   MEDICATIONS AT HOME:   Prior to Admission medications   Not on File      VITAL SIGNS:  Blood pressure 137/69, pulse 111, temperature 98.2 F (36.8 C), temperature source Oral, resp. rate 23, height  (1.905 m), weight 124.201 kg (273 lb 13 oz), SpO2 96 %.  PHYSICAL EXAMINATION:  GENERAL:  64 y.o.-year-old patient lying in the bed with no acute distress.  EYES: Pupils equal, round, reactive to light and accommodation. No scleral icterus. Extraocular muscles intact.  HEENT: Head atraumatic, normocephalic. Oropharynx and nasopharynx clear.  NECK:  Supple, no jugular venous distention. No thyroid enlargement, no tenderness.  LUNGS: Normal breath sounds bilaterally, no wheezing, rales,rhonchi or crepitation. No use of accessory muscles of respiration.  CARDIOVASCULAR: Irregularly irregular. No murmurs, rubs, or gallops.  ABDOMEN: Soft, nontender, nondistended. Obese. Bowel sounds present. No organomegaly or mass.  EXTREMITIES: No pedal edema, cyanosis, or clubbing.  NEUROLOGIC: Cranial nerves II through XII are intact. Muscle strength 5/5 in all extremities. Sensation intact. Gait not  checked.  PSYCHIATRIC: The patient is alert and oriented x 3.  SKIN: Sacral decub with his ulcers are present and gluteal area bilaterally, stage II to 3, covered with fecal matter.   LABORATORY PANEL:   CBC  Recent Labs Lab 12/05/14 1636  WBC 21.0*  HGB 13.8  HCT 41.0  PLT 248    ------------------------------------------------------------------------------------------------------------------  Chemistries   Recent Labs Lab 12/05/14 1636  NA 129*  K 3.6  CL 93*  CO2 26  GLUCOSE 307*  BUN 13  CREATININE 0.86  CALCIUM 8.2*  AST 16  ALT 11*  ALKPHOS 63  BILITOT 0.9   ------------------------------------------------------------------------------------------------------------------  Cardiac Enzymes  Recent Labs Lab 12/05/14 1636  TROPONINI 0.03   ------------------------------------------------------------------------------------------------------------------  RADIOLOGY:  Dg Chest Port 1 View  12/05/2014   CLINICAL DATA:  Hyperglycemia.  EXAM: PORTABLE CHEST - 1 VIEW  COMPARISON:  CT chest 07/12/2013  FINDINGS: The heart size and mediastinal contours are within normal limits. Both lungs are clear. The visualized skeletal structures are unremarkable.  IMPRESSION: No active disease.   Electronically Signed   By: Elige KoHetal  Patel   On: 12/05/2014 15:41    EKG:   Orders placed or performed during the hospital encounter of 12/05/14  . ED EKG  . ED EKG   EKG with sinus tachycardia at 1 15 bpm no acute ST-T wave changes IMPRESSION AND PLAN:   #1 sepsis-from infected sacral decub  Ulcers-  patient meets septic criteria with leukocytosis, sinus tachycardia at the time of admission Will admit the patient to MedSurg unit Blood cultures and wound cultures are ordered Urine cultures are also ordered as  patient has abnormal urinalysis Patient was given 1 dose of IV vancomycin in the ED, will continue the same. Pharmacy consult is placed regarding vancomycin dosing Patient was given IV fluid boluses in the ED and will continue IV fluids Repeat CBC and BMP in a.m.  #2 diabetes mellitus type 2-uncontrolled; patient is currently hyperglycemic Patient has stopped taking medications for the past 5-6 weeks Will check hemoglobin A1c to determine whether patient  needs insulin versus the antihypoglycemic medications We will start him on insulin sliding scale in the meanwhile Consult is placed to diabetic care coordinator  #3 hypertension-blood pressure is elevated next and patient stopped taking medications for the past 5-6 weeks We'll start him on metoprolol 25 mg by mouth twice a day and titrate medications as needed basis  #4 pseudohyponatremia secondary to hyperglycemia Provide gentle hydration with IV fluids and check BMP in a.m. next  #5 chronic history of congestive heart failure   currently not fluid overloaded, not on any home medications. His home medication list is not available, patient is not quite sure whether he is taking diuretics are not We will monitor him closely at this time and provide Lasix if needed  #6 chronic atrial fibrillation-not on any anticoagulation Rate controlled We'll start him on baby aspirin 81 mg by mouth once daily  #7 medical noncompliance and homeless [facility]  #8 tobacco abuse-counseled patient to quit smoking for 3-5 minutes. We will start him on nicotine patch. Patient is agreeable to quit smoking.  Full code, ex-wife is his healthcare power of attorney      All the records are reviewed and case discussed with ED provider. Management plans discussed with the patient, family and they are in agreement.  CODE STATUS: Full code  TOTAL TIME TAKING CARE OF THIS PATIENT: 50 minutes.    Haidar Muse  M.D on 12/05/2014 at 7:57 PM  Between 7am to 6pm - Pager - [phone]  After 6pm go to [email or web address] - password [email or web address] [facility]  [facility]  Office  [phone]  CC: Primary care physician; No PCP Per Patient

## 2014-12-05 NOTE — Progress Notes (Signed)
Dr. Anne HahnWillis notified of order for another wound culture order, orders received.

## 2014-12-05 NOTE — ED Notes (Signed)
Pt rrived via EMS with elevated blood sugar of 380. Pt has been living outside for a reported 6 days. Pt has been off medications for diabetes and hypertention for over 6 weeks and reports feeling week for the past few weeks. Pt reports also having diabetic neuropathy in both feet.

## 2014-12-05 NOTE — ED Provider Notes (Signed)
[facility] Emergency Department Provider Note   Time seen: Approximately 1: 20 PM  I have reviewed the triage vital signs and the nursing notes.   HISTORY  Chief Complaint Hyperglycemia    HPI Nathan Caldwell is a 64 y.o. male presents to the emergency department with a chief complaint of weakness  Patient has been homeless and living outside for the past 6 days.  Has not been taking his medication for diabetes or hypertension for the past 6 weeks. Has had generalized weakness for the past couple of weeks but this is gotten more prevalent over the past 2 days.  Patient also complains of pain and numbness in both his feet. Location is toes to ankles. Duration is several weeks. Timing is constant. Severity is moderate 5/10 quality is numbness modifying factors walking makes it worse. He has been diagnosed with diabetic neuropathy in the past.  Past Medical History  Diagnosis Date  . Hypertension   . Diabetes mellitus without complication   . CHF (congestive heart failure)   . MRSA (methicillin resistant staph aureus) culture positive     1 year ago  . 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   . Diabetic nephropathy   . TIA (transient ischemic attack)    patient reports he has had an irregular heart rate/atrial fibrillation in the past.  There are no active problems to display for this patient.   Past Surgical History  Procedure Laterality Date  . Appendectomy      No current outpatient prescriptions on file.  Allergies Bee venom and Cinnamon  History reviewed. No pertinent family history.  Social History History  Substance Use Topics  . Smoking status: Current Every Day Smoker -- 1.00 packs/day  . Smokeless tobacco: Not on file  . Alcohol Use: No    Review of Systems  Constitutional: No fever/chills Eyes: No visual changes. ENT: No sore throat. Cardiovascular: Denies chest pain. Respiratory: Denies shortness of  breath. Gastrointestinal: No abdominal pain.  No nausea, no vomiting.  No diarrhea.  No constipation. Genitourinary: Negative for dysuria. Musculoskeletal: Negative for back pain. Skin: Negative for rash. Neurological: Negative for headaches, focal weakness, has numbness to both bilateral lower extremities.   10-point ROS otherwise negative.  ____________________________________________   PHYSICAL EXAM:  VITAL SIGNS: ED Triage Vitals  Enc Vitals Group     BP 12/05/14 1335 143/65 mmHg     Pulse Rate 12/05/14 1335 115     Resp 12/05/14 1335 16     Temp 12/05/14 1335 98.2 F (36.8 C)     Temp Source 12/05/14 1335 Oral     SpO2 12/05/14 1335 100 %     Weight 12/05/14 1335 273 lb 13 oz (124.201 kg)     Height 12/05/14 1335 6\' 3"  (1.905 m)     Head Cir --      Peak Flow --      Pain Score --      Pain Loc --      Pain Edu? --      Excl. in GC? --     Constitutional: Alert and oriented. Ill appearing and in no acute distress. Eyes: Conjunctivae are normal. PERRL. EOMI. Head: Atraumatic. Nose: No congestion/rhinnorhea. Mouth/Throat: Mucous membranes are moist.  Oropharynx non-erythematous. Neck: No stridor.   That Cardiovascular: Normal rate, regular rhythm. Grossly normal heart sounds.  Good peripheral circulation. Respiratory: Normal respiratory effort.  No retractions. Lungs CTAB. Gastrointestinal: Soft and nontender. No distention. No abdominal  bruits. No CVA tenderness. Musculoskeletal: No lower extremity tenderness nor edema.  No joint effusions. Neurologic:  Normal speech and language. No gross focal neurologic deficits are appreciated. Speech is normal. No gait instability. Skin:  Skin is warm, dry and intact. No rash noted. Psychiatric: Mood and affect are normal. Speech and behavior are normal.  ____________________________________________   LABS (all labs ordered are listed, but only abnormal results are displayed)  Labs Reviewed - No data to  display ____________________________________________  EKG  Pending at the time of this dictation ____________________________________________  RADIOLOGY  Pending at the time of this dictation ____________________________________________   PROCEDURES  Procedure(s) performed: None  Critical Care performed: No  ____________________________________________   INITIAL IMPRESSION / ASSESSMENT AND PLAN / ED COURSE  Pertinent labs & imaging results that were available during my care of the patient were reviewed by me and considered in my medical decision making (see chart for details).  Elevated blood glucose/uncontrolled diabetes mellitus Uncontrolled atrial fibrillation  In the emergency department the patient was put on cardiac monitor. An IV was started and normal saline IV fluid was administered. Elevated blood sugar of 380 was noted on the CBG. Labs have been ordered to evaluate electrolytes and heme and cardiac status. Urine was also ordered. EKG was ordered. None of these are completed at the time of dictation and signed off to Dr. Derrill KayGoodman. ----------------------------------------- 3:07 PM on 12/05/2014 -----------------------------------------  ____________________________________________   FINAL CLINICAL IMPRESSION(S) / ED DIAGNOSES  Final diagnoses:  None   1. Uncontrolled diabetes mellitus 2. Atrial fibrillation 3. Homeless   Farmer CitySheryl L Yasenia Reedy, [place]DO 12/05/14 [phone]

## 2014-12-05 NOTE — Progress Notes (Signed)
CSW was called by [facility] from the ED regarding Pt who was [place]. Pt's daughter and ex-wife in ED as well, daughter has to return to work. CSW met with Pt's family while Pt was sleeping and medical examination is still pending.    Pt's daughter Manuela Schwartz [phone] provided CSW pt's recent history. Pt's daughter reports that Pt moved out of Senior housing in April after he was no longer able to smoke in his residence per housing authority regulations. Pt moved in with his daughter for 1 week before they moved him into a local motel. Pt's daughter, granddaughter and ex-wife had been coming to assist Pt at his apartment and then the motel.  Pt could not afford the motel with his SSI, so family was trying to help him with living expenses. Pt' ended up having to move out of the motel 6 days ago due to not being able to afford to stay there any longer. Pt unable to go to the homeless shelter due to mobility issues and being unable to care for self as independently as is necessary for the shelter environment.    Pt's family reports local diner feeds patient daily and that his motorized wheelchair is being kept at [place]. CSW will follow up with Pt when he is awake and more medical information if available.   Toma Copier, [place]

## 2014-12-06 LAB — BASIC METABOLIC PANEL
Anion gap: 7 (ref 5–15)
BUN: 13 mg/dL (ref 6–20)
CHLORIDE: 99 mmol/L — AB (ref 101–111)
CO2: 27 mmol/L (ref 22–32)
Calcium: 8.1 mg/dL — ABNORMAL LOW (ref 8.9–10.3)
Creatinine, Ser: 0.74 mg/dL (ref 0.61–1.24)
GFR calc non Af Amer: >60 mL/min (ref >60)
Glucose, Bld: 303 mg/dL — ABNORMAL HIGH (ref 65–99)
Potassium: 3.7 mmol/L (ref 3.5–5.1)
Sodium: 133 mmol/L — ABNORMAL LOW (ref 135–145)

## 2014-12-06 LAB — HEMOGLOBIN A1C: HEMOGLOBIN A1C: 11.2 % — AB (ref 4.0–6.0)

## 2014-12-06 LAB — GLUCOSE, CAPILLARY
GLUCOSE-CAPILLARY: 237 mg/dL — AB (ref 65–99)
GLUCOSE-CAPILLARY: 495 mg/dL — AB (ref 65–99)
Glucose-Capillary: 295 mg/dL — ABNORMAL HIGH (ref 65–99)
Glucose-Capillary: 208 mg/dL — ABNORMAL HIGH (ref 65–99)

## 2014-12-06 LAB — CBC
HCT: 38.9 % — ABNORMAL LOW (ref 40.0–52.0)
HEMOGLOBIN: 13.3 g/dL (ref 13.0–18.0)
MCH: 31.8 pg (ref 26.0–34.0)
MCHC: 34.1 g/dL (ref 32.0–36.0)
MCV: 93.2 fL (ref 80.0–100.0)
Platelets: 236 10*3/uL (ref 150–440)
RBC: 4.18 MIL/uL — AB (ref 4.40–5.90)
RDW: 12.7 % (ref 11.5–14.5)
WBC: 15.7 10*3/uL — ABNORMAL HIGH (ref 3.8–10.6)

## 2014-12-06 LAB — CREATININE, SERUM
Creatinine, Ser: 0.8 mg/dL (ref 0.61–1.24)
GFR calc Af Amer: >60 mL/min (ref >60)
GFR calc non Af Amer: >60 mL/min (ref >60)

## 2014-12-06 LAB — VANCOMYCIN, TROUGH: Vancomycin Tr: 6 ug/mL — ABNORMAL LOW (ref 10–20)

## 2014-12-06 MED ORDER — INSULIN GLARGINE 100 UNIT/ML ~~LOC~~ SOLN
20.0000 [IU] | Freq: Every day | SUBCUTANEOUS | Status: DC
  Administered 2014-12-06 – 2014-12-08 (×3): 20 [IU] via SUBCUTANEOUS
  Filled 2014-12-06 (×6): qty 0.2

## 2014-12-06 MED ORDER — INSULIN ASPART 100 UNIT/ML ~~LOC~~ SOLN
0.0000 [IU] | Freq: Three times a day (TID) | SUBCUTANEOUS | Status: DC
  Administered 2014-12-06: 7 [IU] via SUBCUTANEOUS
  Administered 2014-12-07: 18:00:00 3 [IU] via SUBCUTANEOUS
  Administered 2014-12-07: 08:00:00 4 [IU] via SUBCUTANEOUS
  Administered 2014-12-07 – 2014-12-08 (×2): 7 [IU] via SUBCUTANEOUS
  Administered 2014-12-08: 09:00:00 4 [IU] via SUBCUTANEOUS
  Filled 2014-12-06: qty 4
  Filled 2014-12-06 (×3): qty 7
  Filled 2014-12-06: qty 3
  Filled 2014-12-06: qty 4

## 2014-12-06 MED ORDER — PIPERACILLIN-TAZOBACTAM 3.375 G IVPB
3.3750 g | Freq: Three times a day (TID) | INTRAVENOUS | Status: DC
  Administered 2014-12-06 – 2014-12-11 (×15): 3.375 g via INTRAVENOUS
  Filled 2014-12-06 (×18): qty 50

## 2014-12-06 NOTE — Consult Note (Signed)
WOC wound consult note Reason for Consult: Bilateral unstageable pressure ulcers to ischial tuberosity.  States he has been exclusively sitting in a wheelchair for a week.  Has no stable housing arrangement at this time.   Wound type:Unstageable pressure ulcers, present on admission.  Pressure Ulcer POA: Yes Measurement: Right ischium:  5 cm x 5 cm 100% eschar to wound bed with foul necrotic odor.  Left ischium: 4 cm x 5 cm 100% eschar to wound bed with foul necrotic odor.  Wound bed: 100% devitalized tissue.  Drainage (amount, consistency, odor) Moderate serosanguinous drainage.  Foul necrotic odor.  Periwound: Erythema present circumferentially Dressing procedure/placement/frequency: Referral to surgery recommended at this time.  Will get Low Air Loss Mattress ordered. Cleanse bilateral ischial wounds with NS and pat gently dry.  Apply Allevyn silicone border foam.  Change every other day while awaiting surgical consult.  Will not follow at this time.  Please re-consult if needed.  Maple HudsonKaren Sem Mccaughey RN BSN CWON Pager [phone]

## 2014-12-06 NOTE — Plan of Care (Signed)
Problem: Discharge Progression Outcomes Goal: Other Discharge Outcomes/Goals Plan of care progress to goal:  No complaints of pain. Patient remains on telemetry - NSR/ST. Wound consult completed today - recommending referral to surgery. Patient up to chair with PT this shift. High fall risk - bed alarm on and room near nurses station. Isolation precautions remain for MRSA, ESBL and VRE.

## 2014-12-06 NOTE — Progress Notes (Signed)
On call MD paged to question if pt requires telemetry due to history of CHF and A fib and admission diagnosis of sepsis with high HR. MD acknowledge and order received for inpatient telemetry.  Nathan StagersSkyler Charell Caldwell [employer]

## 2014-12-06 NOTE — Progress Notes (Signed)
Inpatient Diabetes Program Recommendations  AACE/ADA: New Consensus Statement on Inpatient Glycemic Control (2013)  Target Ranges:  Prepandial:   less than 140 mg/dL      Peak postprandial:   less than 180 mg/dL (1-2 hours)      Critically ill patients:  140 - 180 mg/dL   Reason for Assessment:  Results for Nathan Caldwell, Nathan Caldwell (MRN [id number]) as of 12/06/2014 10:15  Ref. Range 12/05/2014 17:39 12/06/2014 08:26  Glucose-Capillary Latest Ref Range: 65-99 mg/dL 147262 (H) 829237 (H)    Diabetes history: Type 2 diabetes Outpatient Diabetes medications: According to H&P, patient has not been taking diabetes medications Current orders for Inpatient glycemic control:  Novolog sensitive tid with meals  Consider adding Lantus 20 units daily while patient is in the hospital.  A1C ordered, which will help determine patients needs after discharge.  Will follow.  Thanks, Beryl MeagerJenny Adelae Yodice, RN, BC-ADM Inpatient Diabetes Coordinator Pager [phone] (8a-5p)

## 2014-12-06 NOTE — Progress Notes (Addendum)
ANTIBIOTIC CONSULT NOTE - FOLLOW UP  Pharmacy Consult for Vancomycin/Zosyn Indication: rule out sepsis  Allergies  Allergen Reactions  . Bee Venom   . Cinnamon     Patient Measurements: Height: 6\' 3"  (190.5 cm) Weight: 269 lb 1.6 oz (122.063 kg) IBW/kg (Calculated) : 84.5 Adjusted Body Weight:   Vital Signs: Temp: 98.8 F (37.1 C) (05/18 0513) Temp Source: Oral (05/18 0513) BP: 122/60 mmHg (05/18 0513) Pulse Rate: 78 (05/18 0513) Intake/Output from previous day: 05/17 0701 - 05/18 0700 In: -  Out: 700 [Urine:700] Intake/Output from this shift: Total I/O In: -  Out: 400 [Urine:400]  Labs:  Recent Labs  12/05/14 1636 12/05/14 2320 12/06/14 0437  WBC 21.0* 18.4* 15.7*  HGB 13.8 13.2 13.3  PLT 248 238 236  CREATININE 0.86 0.80 0.74   Estimated Creatinine Clearance: 131.3 mL/min (by C-G formula based on Cr of 0.74).  Recent Labs  12/06/14 0437  VANCOTROUGH 6*     Microbiology: Recent Results (from the past 720 hour(s))  Culture, blood (routine x 2)     Status: None (Preliminary result)   Collection Time: 12/05/14  9:32 PM  Result Value Ref Range Status   Specimen Description BLOOD  Final   Special Requests NONE  Final   Culture NO GROWTH < 12 HOURS  Final   Report Status PENDING  Incomplete  Culture, blood (routine x 2)     Status: None (Preliminary result)   Collection Time: 12/05/14  9:32 PM  Result Value Ref Range Status   Specimen Description BLOOD  Final   Special Requests NONE  Final   Culture NO GROWTH < 12 HOURS  Final   Report Status PENDING  Incomplete    Anti-infectives    Start     Dose/Rate Route Frequency Ordered Stop   12/06/14 0400  vancomycin (VANCOCIN) 1,250 mg in sodium chloride 0.9 % 250 mL IVPB     1,250 mg 166.7 mL/hr over 90 Minutes Intravenous Every 8 hours 12/05/14 2314     12/05/14 1915  vancomycin (VANCOCIN) IVPB 1000 mg/200 mL premix     1,000 mg 200 mL/hr over 60 Minutes Intravenous  Once 12/05/14 1903 12/05/14 2227       Assessment: Patient is a 64 yo male admitted with possible sepsis.  Patient with infected sacral decub ulcers and receiving treatment with IV Vancomycin.   SCr: 0.74 (0.8), est CrCl~120 mL/min, ke: 0.104, t1/2: 6.7 h, Vd: 70 L  Trough drawn this AM resulted as 6.  This lab was drawn too early as patient had only received one dose.   Goal of Therapy:  Goal Vancomycin trough of 15-20   Plan:  Continue current orders for Vancomycin 1250 mg IV q8h.  Will order trough prior to 5th dose on 5/19 at 1100.  Blood cultures, urine cultures, and wound cultures pending.   Will start patient on Zosyn 3.375 gm IV q8h per EI protocol based on renal function.  Pharmacy will continue to follow.  Clarisa Schoolsrystal Chrystel Barefield, PharmD Clinical Pharmacist 12/06/2014

## 2014-12-06 NOTE — Care Management Note (Signed)
Case Management Note  Patient Details  Name: Nathan Caldwell MRN: [id number] Date of Birth: 06-26-1951  Subjective/Objective:                  Patient sitting up in bed; on contact isolation. Per patient he "is living on the streets after staying at the homeless shelter but was kicked out because of being in a wheelchair". Per RN patient has skin breakdown and patient is diabetic. He states he is not taking any medications. He was established with [facility] but states "he's not sure they will take me back cause I made them mad". [place]. He states "he is unable to work because he is not interested in working with his hands". He has a daughter "that lives in [address] [place] but works two jobs and can't hardly make it herself, so living with her is not an option". He indicated that he doesn't get along with her boyfriend- said "he was sorry and wouldn't work so his daughter has to work two jobs". CSW involved from ED and now on this unit for discharge plan. CSW asked that this RNCM contact Medicaid officer Nathan Caldwell to discuss patient payor. CSW involved with placement/housing needs. Patient states his "is not taking any medications". Patient states he has been in a wheelchair for 2 months now.   Action/Plan: Message left for Nathan MilletRose Caldwell to check Medicaid application/status. Recommending diabetic nursing consult to assist with this need (BS >200). Offered Vocational Rehabilitation to assist with entering into the workforce; patient is undecided. Offered to establish him back with [facility]; patient is undecided. Will leave application to Medication Management and [facility] to re-start application process. Emailed [facility] to see if they would take him back.    Expected Discharge Date:                  Expected Discharge Plan:     In-House Referral:  Clinical Social Work  Discharge planning Services  CM Consult  Post Acute Care Choice:     Choice offered to:  Patient  DME Arranged:    DME Agency:     HH Arranged:    HH Agency:     Status of Service:  In process, will continue to follow  Medicare Important Message Given:    Date Medicare IM Given:    Medicare IM give by:    Date Additional Medicare IM Given:    Additional Medicare Important Message give by:     If discussed at Long Length of Stay Meetings, dates discussed:    Additional Comments:  Nathan Siadngela Gordon [place], RN 12/06/2014, 10:12 AM

## 2014-12-06 NOTE — Progress Notes (Signed)
Spoke to Dr. Allena KatzPatel, Jearl KlinefelterSona - regarding anaerobic bottle with gram positive cocci in chain. IV antibiotics previously ordered. Bo McclintockBrewer,Ailyn Gladd S, RN

## 2014-12-06 NOTE — Progress Notes (Signed)
PT Cancellation Note  Patient Details Name: Nathan GlaserStephen Curley MRN: [id number] DOB: 09-16-1950   Cancelled Treatment:    Reason Eval/Treat Not Completed: Fatigue/lethargy limiting ability to participate. Chart reviewed, RN consulted. Holding pt treatment at this time at request of RN who reports that patient is still very lethargic and weak, and likely unable to participate at this time. Will attempt at later date/time.     Mignonne Afonso C 12/06/2014, 9:07 AM  Rosamaria LintsAllan C Ginnette Gates, PT, DPT, BM

## 2014-12-06 NOTE — Progress Notes (Signed)
Initial Nutrition Assessment  DOCUMENTATION CODES:     INTERVENTION:  Meals and Snacks: Cater to patient preferences  NUTRITION DIAGNOSIS:  Increased nutrient needs related to wound healing as evidenced by  (stage II pressure ulcers present).  GOAL:  Patient will meet greater than or equal to 90% of their needs  MONITOR:   (Energy Intake, Glucose Profile, Electrolyte and Renal Profile, Skin)  REASON FOR ASSESSMENT:   (Pressure Ulcers)    ASSESSMENT:  Pt admitted with sepsis secondary to decubitus ulcers. PMHx: Past Medical History  Diagnosis Date  . Hypertension   . Diabetes mellitus without complication   . CHF (congestive heart failure)   . MRSA (methicillin resistant staph aureus) culture positive     1 year ago  . 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   . Diabetic nephropathy   . TIA (transient ischemic attack)    PO Intake: pt reports good appetite, eating 100% of meals since admission. Pt reports having a good appetite PTA as well. Pt reports eating multiple times at restaurants where friends work and having charge accounts at a local convenient store PTA. Pt reports eating 3 meals per day. 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[facility])  Medications: Novolog, Lantus Labs: Electrolyte and Renal Profile:    Recent Labs Lab 12/05/14 1636 12/05/14 2320 12/06/14 0437  BUN 13  --  13  CREATININE 0.86 0.80 0.74  NA 129*  --  133*  K 3.6  --  3.7   Glucose Profile:  Recent Labs  12/05/14 1739 12/06/14 0826 12/06/14 1109  GLUCAP 262* 237* 295*   Protein Profile:  Recent Labs Lab 12/05/14 1636  ALBUMIN 2.6*     Height:  Ht Readings from Last 1 Encounters:  12/05/14 6\' 3"  (1.905 m)    Weight:  Wt Readings from Last 1 Encounters:  12/06/14 269 lb 1.6 oz (122.063 kg)    Ideal Body Weight:     Wt Readings from Last 10 Encounters:  12/06/14 269 lb 1.6 oz (122.063 kg)    BMI:  Body mass index is 33.64 kg/(m^2).  Skin:   (multiple stage  II Pressure ulcers on buttock per Nsg)  Diet Order:  Diet heart healthy/carb modified Room service appropriate?: Yes; Fluid consistency:: Thin  EDUCATION NEEDS:  No education needs identified at this time   Intake/Output Summary (Last 24 hours) at 12/06/14 1419 Last data filed at 12/06/14 1133  Gross per 24 hour  Intake      0 ml  Output   1500 ml  Net  -1500 ml    Last BM:  5/16  LOW Care Level  Nathan Caldwell, RD, LDN Pager [phone]

## 2014-12-06 NOTE — Plan of Care (Signed)
Problem: Discharge Progression Outcomes Goal: Discharge plan in place and appropriate Individualization: Pt prefers to be called Nathan SeniorStephen Hx CHF, DM, HTN, AFib, & Neuropathy which are not controlled by home medications currently. Pt is homeless and has been unable to obtain medications for the last 4-6 weeks. Pt is alert but very lethargic with generalized increased weakness but uses a wheelchair at baseline Pt is a high fall risk and understands how to utilize the call system for assistance  Goal: Other Discharge Outcomes/Goals Plan of care progress to goals: Pain: no c/o of pain, just expressed tenderness when getting wound culture sample. Denied wanting pain medication Hemodynamically: BP high upon arrival but is trending down, HR tachy, Blood sugar high but insulin coverage ordered, 2L fluid given over night Complications: Pressure ulcer one on  Right and one on Left buttock-- cultures sent, wound care consulted, foam applied Tolerating diet: no problem identified, good appetite. Activity: pt is bedrest, bilateral lower extremity weakness and very lethargic. Pt requires assistance for turning and moving in the bed at this time No fall or injury this shift

## 2014-12-06 NOTE — Evaluation (Signed)
Physical Therapy Evaluation Patient Details Name: Nathan GlaserStephen Caldwell MRN: [id number] DOB: 05-Sep-1950 Today's Date: 12/06/2014   History of Present Illness  Pt is a 64yo white Male who presented to the ED after reports of significant lethargy and fatigue.  Pt found to have bilat sacral ulcers upon admission. Pt has been homeless and 'living in the streets' for the last ~6 weeks, and using a motorized W/C for mobility. Pt reports that he began to experience increased falls back in Jan/Feb at which point he acquired an electric W/C from a friend. Pt describes rapid loss of motor function in LLE, with paresthesia and alodynia.   Clinical Impression  Pt presenting with global fatigue and moderate pain, but agreeable to participate in PT evaluation. Pt describes a 3-4 month history of rapid decline in LLE, which corresponds well with clinical findings at this visit, including hypotonia, flaccidity, paresthesia, and pitting edema. Pt has acquired DME through non traditional sources and has not received formal education on how to minimize ischial pressure ulcers, etc. Pt will benefit from skilled intervention for education and reconditioning. Patient presents with impairment of strength, pain, range of motion, and activity tolerance, limiting ability to perform ADL, IADL, and ambulation. Patient will benefit from skilled intervention to address the above impairments and limitations, in order to restore to prior level of function and to decrease caregiver burden.       Follow Up Recommendations Supervision for mobility/OOB    Equipment Recommendations  Wheelchair (measurements PT);Wheelchair cushion (measurements PT)    Recommendations for Other Services       Precautions / Restrictions Precautions Precautions: Fall Restrictions Weight Bearing Restrictions: No      Mobility  Bed Mobility Overal bed mobility: Needs Assistance Bed Mobility: Sidelying to Sit Rolling: Mod assist Sidelying to sit: Mod  assist       General bed mobility comments: Taught self-hook with BLE; required assistance with trunk righting.   Transfers Overall transfer level: Modified independent Equipment used: None Transfers: Lateral/Scoot Transfers          Lateral/Scoot Transfers: Modified independent (Device/Increase time);From elevated surface General transfer comment: from bed to recliner.   Ambulation/Gait Ambulation/Gait assistance:  (unable to perform)              Stairs            Wheelchair Mobility    Modified Rankin (Stroke Patients Only)       Balance Overall balance assessment: Modified Independent (Does not tolerate testing well, secondary to back pain. )                                           Pertinent Vitals/Pain Pain Assessment: 0-10 Pain Score: 6  Pain Location: Chronic low back pain. Experiences LLE pain upon PROM at examination.  Pain Descriptors / Indicators: Aching Pain Intervention(s): Monitored during session;Limited activity within patient's tolerance    Home Living Family/patient expects to be discharged to:: Shelter/Homeless                 Additional Comments: Nathan Caldwell was living at a resthome up until early Spring, when he left due to a dispute over lease and smoking, and lived in an extended stay hotel for a while before he ran short of funds. Pt claims he has no family locally that he can depend on for help.     Prior Function Level  of Independence: Independent with assistive device(s)   Gait / Transfers Assistance Needed: Was performing lateral scooting transfers ModI. Unable to perform ambulation due to LLE paresis x 3 months.   ADL's / Homemaking Assistance Needed: N/A         Hand Dominance        Extremity/Trunk Assessment   Upper Extremity Assessment: Generalized weakness           Lower Extremity Assessment: Generalized weakness;LLE deficits/detail   LLE Deficits / Details: LLE presents with visible  edema, most prevalent in the foot, with +2 pitting edema in the lower calf. Muscles are hypotonic upon palpation of quads and gastrocnemius, with mild tightness in the hamstrings, that is painful with PROM. L hip is positioned in ER/Abd and does tolerate return to neurtal positioning. Pt has 3+/5 MMT of hip flexion with flaccid quads.   Cervical / Trunk Assessment: Kyphotic  Communication   Communication: No difficulties  Cognition Arousal/Alertness: Awake/alert Behavior During Therapy: Anxious;WFL for tasks assessed/performed (Fear avoidance behaviors related to chronic pain; very cautious) Overall Cognitive Status: Within Functional Limits for tasks assessed                      General Comments      Exercises        Assessment/Plan    PT Assessment Patient needs continued PT services  PT Diagnosis Generalized weakness;Other (comment)   PT Problem List Decreased strength;Impaired tone;Obesity;Decreased range of motion;Decreased knowledge of use of DME;Decreased activity tolerance;Decreased safety awareness;Decreased skin integrity;Decreased balance;Decreased knowledge of precautions;Pain;Decreased mobility;Impaired sensation  PT Treatment Interventions DME instruction;Functional mobility training;Therapeutic activities;Therapeutic exercise;Patient/family education   PT Goals (Current goals can be found in the Care Plan section) Acute Rehab PT Goals Patient Stated Goal: Find a new place to rent with his disability money.  PT Goal Formulation: Patient unable to participate in goal setting Time For Goal Achievement: 12/20/14 Potential to Achieve Goals: Fair    Frequency Min 2X/week   Barriers to discharge Other (comment) Pt is currently homeless with ambiguous plans at obtaining permanent residence.      Co-evaluation               End of Session   Activity Tolerance: Patient limited by lethargy;Patient limited by pain;Patient tolerated treatment well Patient  left: in chair;with call bell/phone within reach Nurse Communication: Other (comment);Mobility status;Weight bearing status (LLE swelling and elevation. )         Time: [id number] PT Time Calculation (min) (ACUTE ONLY): 30 min   Charges:   PT Evaluation $Initial PT Evaluation Tier I: 1 Procedure     PT G Codes:        Jaquarius Seder C 12/06/2014, 1:28 PM Rosamaria LintsAllan C Christabell Loseke, PT, DPT, BM

## 2014-12-06 NOTE — Progress Notes (Signed)
[facility] [place] at [facility]   PATIENT NAME: Nathan Caldwell    MR#:  [id number]  DATE OF BIRTH:  Apr 11, 1951  SUBJECTIVE:  Came in with bilateral sacral nonhealing ulcers. Homeless  REVIEW OF SYSTEMS:    Review of Systems  Constitutional: Positive for malaise/fatigue. Negative for fever, chills and weight loss.  HENT: Negative for ear discharge, ear pain and nosebleeds.   Eyes: Negative for blurred vision, pain and discharge.  Respiratory: Negative for sputum production, shortness of breath, wheezing and stridor.   Cardiovascular: Negative for chest pain, palpitations, orthopnea and PND.  Gastrointestinal: Negative for nausea, vomiting, abdominal pain and diarrhea.  Genitourinary: Negative for urgency and frequency.  Musculoskeletal: Positive for back pain. Negative for joint pain.  Skin: Positive for rash.  Neurological: Positive for weakness. Negative for sensory change, speech change and focal weakness.  Psychiatric/Behavioral: Positive for hallucinations. Negative for depression. The patient is not nervous/anxious.   large bilateral about 5x5 cm foulsmlling ulcers chronic over the buttocks with 100% eschar over it  Tolerating Diet:yes Tolerating PT: NO, Wheelchair bound  DRUG ALLERGIES:   Allergies  Allergen Reactions  . Bee Venom   . Cinnamon     VITALS:  Blood pressure 122/60, pulse 78, temperature 98.8 F (37.1 C), temperature source Oral, resp. rate 18, height 6\' 3"  (1.905 m), weight 122.063 kg (269 lb 1.6 oz), SpO2 99 %.  PHYSICAL EXAMINATION:   Physical Exam  GENERAL:  64 y.o.-year-old patient lying in the bed with no acute distress.  EYES: Pupils equal, round, reactive to light and accommodation. No scleral icterus. Extraocular muscles intact.  HEENT: Head atraumatic, normocephalic. Oropharynx and nasopharynx clear.  NECK:  Supple, no jugular venous distention. No thyroid enlargement, no tenderness.  LUNGS: Normal breath  sounds bilaterally, no wheezing, rales, rhonchi. No use of accessory muscles of respiration.  CARDIOVASCULAR: S1, S2 normal. No murmurs, rubs, or gallops.  ABDOMEN: Soft, nontender, nondistended. Bowel sounds present. No organomegaly or mass.  EXTREMITIES: No cyanosis, clubbing or edema b/l.    NEUROLOGIC: Cranial nerves II through XII are intact. No focal Motor or sensory deficits b/l.   PSYCHIATRIC: The patient is alert and oriented x 3.  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 ------------------------------------------------------------------------------------------------------------------  Cardiac Enzymes  Recent Labs Lab 12/05/14 1636  TROPONINI 0.03   ------------------------------------------------------------------------------------------------------------------  RADIOLOGY:  Dg Chest Port 1 View  12/05/2014   CLINICAL DATA:  Hyperglycemia.  EXAM: PORTABLE CHEST - 1 VIEW  COMPARISON:  CT chest 07/12/2013  FINDINGS: The heart size and mediastinal contours are within normal limits. Both lungs are clear. The visualized skeletal structures are unremarkable.  IMPRESSION: No active disease.   Electronically Signed   By: Elige KoHetal  Lorrin Nawrot   On: 12/05/2014 15:41     ASSESSMENT AND PLAN:    #1 sepsis-from infected sacral decub Ulcers patient meets septic criteria with  leukocytosis, sinus tachycardia at the time of admission Blood cultures and wound cultures are ordered -IV vanc and zosyn  #2 diabetes mellitus type 2-uncontrolled; patient is currently hyperglycemic Patient has stopped taking medications for the past 5-6 weeks -Insulin Lantus and high dose ssi -compliance is going to be a chronic issue given his financial situation  #3 hypertension-blood pressure is elevated next and patient stopped taking medications for the past 5-6 weeks  on metoprolol 25 mg by mouth twice a day   #4 pseudohyponatremia secondary to hyperglycemia               Provide gentle hydration with IV fluids   #5 chronic history of congestive heart failure  currently not fluid overloaded, not on any home medications.  #6 chronic atrial fibrillation-not on any anticoagulation Rate controlled on aspirin 81 mg daily  #7 medical noncompliance and homeless Consult is placed to Case management  #8 tobacco abuse-counseled patient to quit smoking for 3-5 minutes. We will start him on nicotine patch. Patient is agreeable to quit smoking.  Case discussed with Care Management/Social Worker. Management plans discussed with the patient, family and they are in agreement.  CODE STATUS: full  DVT Prophylaxis: heparin  TOTAL TIME TAKING CARE OF THIS PATIENT: 30 minutes.   POSSIBLE D/C IN 3-4 DAYS, DEPENDING ON CLINICAL CONDITION.   Ferris Tally M.D on 12/06/2014 at 2:07 PM  Between 7am to 6pm - Pager - [phone]  After 6pm go to [email or web address] - password [email or web address] [facility]  Office  [phone]  CC: Primary care physician; No PCP Per Patient

## 2014-12-07 DIAGNOSIS — F4321 Adjustment disorder with depressed mood: Secondary | ICD-10-CM

## 2014-12-07 DIAGNOSIS — E119 Type 2 diabetes mellitus without complications: Secondary | ICD-10-CM

## 2014-12-07 DIAGNOSIS — Z22322 Carrier or suspected carrier of Methicillin resistant Staphylococcus aureus: Secondary | ICD-10-CM

## 2014-12-07 DIAGNOSIS — F54 Psychological and behavioral factors associated with disorders or diseases classified elsewhere: Secondary | ICD-10-CM

## 2014-12-07 LAB — GLUCOSE, CAPILLARY
GLUCOSE-CAPILLARY: 195 mg/dL — AB (ref 65–99)
GLUCOSE-CAPILLARY: 146 mg/dL — AB (ref 65–99)
Glucose-Capillary: 226 mg/dL — ABNORMAL HIGH (ref 65–99)
Glucose-Capillary: 258 mg/dL — ABNORMAL HIGH (ref 65–99)

## 2014-12-07 LAB — VANCOMYCIN, TROUGH: Vancomycin Tr: 14 ug/mL (ref 10–20)

## 2014-12-07 MED ORDER — VANCOMYCIN HCL 10 G IV SOLR
1500.0000 mg | Freq: Three times a day (TID) | INTRAVENOUS | Status: DC
  Administered 2014-12-07 – 2014-12-08 (×4): 1500 mg via INTRAVENOUS
  Filled 2014-12-07 (×6): qty 1500

## 2014-12-07 NOTE — Plan of Care (Signed)
Problem: Discharge Progression Outcomes Goal: Discharge plan in place and appropriate Individualization:  Individualization of Care:   Patient prefers to be called Nathan Caldwell. History of DM, HTN, AFib, & Neuropathy which are not controlled by home medications currently. Patient is homeless and has been unable to obtain medications for the last 4-6 weeks. Patient is alert but very lethargic with generalized increased weakness but uses a wheelchair at baseline.  Patient is a high fall risk and understands how to utilize the call system for assistance.     Goal: Other Discharge Outcomes/Goals Plan of care progress to goal:  Patient complaining of lower back pain; pain medication given, relief noted. Patient is refusing heparin and nicotine patch. Patient remains on telemetry - NSR. Patient is now on specialty bed. Surgery consult completed - recommending no debridement. Isolation precautions remain for MRSA, ESBL and VRE.

## 2014-12-07 NOTE — Progress Notes (Signed)
Inpatient Diabetes Program Recommendations  AACE/ADA: New Consensus Statement on Inpatient Glycemic Control (2013)  Target Ranges:  Prepandial:   less than 140 mg/dL      Peak postprandial:   less than 180 mg/dL (1-2 hours)      Critically ill patients:  140 - 180 mg/dL   Reason for Assessment: elevated A1C and CBG  Diabetes history: Type 2 since age [age]yrs Outpatient Diabetes medications: None in the past 4-5 weeks- previously he was getting Lantus and Novolog sy[facility]; was taking Lantus 50 units qhs and Novolog sliding scale tid based on his blood sugars - both by syringe  Current orders for Inpatient glycemic control: Novolog resistant correction 0-20 units tid, Lantus 20 units qday  Please consider adding Novolog hs correction scale (from glycemic control order set) and mealtime Novolog correction 4 units tid (hold if patient eats less than 50 %).  This mealtime insulin is in addition to his Novolog correction already ordered.  Likely will require more Lantus but I will continue to assess.  Susette RacerJulie Leopold Smyers, RN, BA, MHA, CDE Diabetes Coordinator Inpatient Diabetes Program  [phone] (Team Pager) [phone] [facility] Office) 12/07/2014 12:35 PM

## 2014-12-07 NOTE — Consult Note (Signed)
[facility] Face-to-Face Psychiatry Consult   Reason for Consult:  Consult on this 64 year old man with a history of multiple medical problems. 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 Diabetes [E11.9] 12/07/2014  . MRSA (methicillin resistant staph aureus) culture positive [Z22.322] 12/07/2014  . Adjustment disorder with depressed mood [F43.21] 12/07/2014  . 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 Sepsis affecting skin [L02.91] 12/05/2014    Total Time spent with patient: 1 hour  Subjective:   Nathan Caldwell is a 64 y.o. male patient admitted with "I just wasn't feeling right". Patient also admits that his mood is been bad but blames it on his multiple medical and social problems.Marland Kitchen  HPI:  Information obtained from the patient and the chart. Question appears to been raised about depression in part because of his poor self-care and repeated hospitalizations. The patient states that for the last few weeks he's been living out on the street. When he got cold and rainy he was still living out on the street. A policeman stopped him and questioned him and convinced him to come to the hospital. Patient says that he's been feeling run down and sick recently and isn't surprised that he has an infection. He has not been compliant with any of his usual prescribed medications and doesn't even have prescriptions for them. Hasn't been able to afford his medicines particularly not affording the insulin. H is mood has been a little depressed but says that he thinks that's normal given his circumstance of being homeless and sick with multiple medical problems. He doesn't feel hopeless. He denies having any suicidal ideation. Says that he usually sleeps  okay when he is not in pain. Appetite is generally adequate. Does not report auditory or visual hallucinations.  Patient says that he's never seen a psychiatrist or mental health provider in the past. Never been psychiatrically hospitalized. Denies any history of suicide attempts. Says that he is "strong minded"  Social history is that the patient is not currently married. He is estranged from most of his family. He is currently homeless because his subsidized housing where he had been living previously had switched to a smoke-free policy and the patient refused to comply with that. Therefore for the last couple months he's been living either in motels or on the street. He does get a Fish farm manager pension however, he says that he does not have Medicare or Medicaid. He claims that he has applied for disability and been turned down which is one of his many grievances against "the government". 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 I'm not sure what to make of this but I will taken at face value for now.  Medical history is positive for multiple serious medical problems. Patient has diabetes requiring insulin treatment. Has congestive heart failure high blood pressure has a history of a myocardial infarction and a stroke in the past. He has methicillin-resistant staph aureus and has had skin infections.  Substance abuse history is negative. He claims that he does not drink or use any other recreational drugs.  Family history is negative by his report.  Current medications as an outpatient or none HPI Elements:   Quality:  Moderate low mood. Severity:  Mild to moderate. Timing:  Positive for the last couple months. Duration:  Intermittent. Context:  Homelessness.  Past Medical History:  Past Medical History  Diagnosis Date  . Hypertension   . Diabetes mellitus without complication   .  CHF (congestive heart failure)   . MRSA (methicillin resistant staph aureus) culture positive     1 year ago  . 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   . Diabetic nephropathy   . TIA (transient ischemic attack)     Past Surgical History  Procedure Laterality Date  . Appendectomy     Family History: History reviewed. No pertinent family history. Social History:  History  Alcohol Use No     History  Drug Use Not on file    History   Social History  . Marital Status: Divorced    Spouse Name: N/A  . Number of Children: N/A  . Years of Education: N/A   Social History Main Topics  . Smoking status: Current Every Day Smoker -- 1.00 packs/day  . Smokeless tobacco: Not on file  . Alcohol Use: No  . Drug Use: Not on file  . Sexual Activity: Not on file   Other Topics Concern  . None   Social History Narrative  . None   Additional Social History:                          Allergies:   Allergies  Allergen Reactions  . Bee Venom   . Cinnamon     Labs:  Results for orders placed or performed during the hospital encounter of 12/05/14 (from the past 48 hour(s))  Glucose, capillary     Status: Abnormal   Collection Time: 12/05/14  5:39 PM  Result Value Ref Range   Glucose-Capillary 262 (H) 65 - 99 mg/dL  Blood gas, venous     Status: Abnormal   Collection Time: 12/05/14  6:00 PM  Result Value Ref Range   FIO2 21.00 %   pH, Ven 7.36 7.320 - 7.430   pCO2, Ven 52 44.0 - 60.0 mmHg   Bicarbonate 29.4 (H) 21.0 - 28.0 mEq/L   Acid-Base Excess 2.8 0.0 - 3.0 mmol/L   Patient temperature 37.0    Collection site VENOUS    Sample type VENOUS   Lactic acid, plasma     Status: None   Collection Time: 12/05/14  6:03 PM  Result Value Ref Range   Lactic Acid, Venous 2.0 0.5 - 2.0 mmol/L  Culture, blood (routine x 2)     Status: None (Preliminary result)   Collection Time: 12/05/14  9:32 PM  Result Value Ref Range   Specimen Description BLOOD    Special Requests  NONE    Culture NO GROWTH 2 DAYS    Report Status PENDING   Culture, blood (routine x 2)     Status: None (Preliminary result)   Collection Time: 12/05/14  9:32 PM  Result Value Ref Range   Specimen Description BLOOD    Special Requests NONE    Culture  Setup Time      GRAM POSITIVE COCCI IN CHAINS IN BOTH AEROBIC AND ANAEROBIC BOTTLES IDENTIFICATION TO FOLLOW CRITICAL RESULT CALLED TO, READ BACK BY AND VERIFIED WITH: SHAY BREWER AT 1455 CTJ CRITICAL VALUE NOTED.  VALUE IS CONSISTENT WITH PREVIOUSLY REPORTED AND CALLED VALUE. TSH 12/06/14.    Culture      GRAM POSITIVE COCCI IN  BOTH AEROBIC AND ANAEROBIC BOTTLES IDENTIFICATION TO FOLLOW    Report Status PENDING   Wound culture     Status: None (Preliminary result)   Collection Time: 12/05/14 11:10 PM  Result Value Ref Range   Specimen Description SACRAL    Special Requests NONE    Gram Stain      RARE WBC SEEN FEW GRAM POSITIVE COCCI IN PAIRS RARE GRAM NEGATIVE RODS    Culture      LIGHT GROWTH GRAM NEGATIVE RODS IDENTIFICATION TO FOLLOW SUSCEPTIBILITIES TO FOLLOW    Report Status PENDING   Wound culture     Status: None (Preliminary result)   Collection Time: 12/05/14 11:10 PM  Result Value Ref Range   Specimen Description ARM    Special Requests SWAB    Gram Stain      RARE WBC SEEN MODERATE GRAM POSITIVE COCCI IN PAIRS IN SINGLES FEW GRAM POSITIVE RODS FEW GRAM NEGATIVE RODS    Culture      MODERATE GROWTH GRAM NEGATIVE RODS LIGHT GROWTH GRAM NEGATIVE RODS IDENTIFICATION TO FOLLOW SUSCEPTIBILITIES TO FOLLOW    Report Status PENDING   CBC     Status: Abnormal   Collection Time: 12/05/14 11:20 PM  Result Value Ref Range   WBC 18.4 (H) 3.8 - 10.6 K/uL   RBC 4.20 (L) 4.40 - 5.90 MIL/uL   Hemoglobin 13.2 13.0 - 18.0 g/dL   HCT 39.0 (L) 40.0 - 52.0 %   MCV 93.0 80.0 - 100.0 fL   MCH 31.4 26.0 - 34.0 pg   MCHC 33.7 32.0 - 36.0 g/dL   RDW 13.0 11.5 - 14.5 %   Platelets 238 150 - 440 K/uL  Creatinine, serum      Status: None   Collection Time: 12/05/14 11:20 PM  Result Value Ref Range   Creatinine, Ser 0.80 0.61 - 1.24 mg/dL   GFR calc non Af Amer >60 >60 mL/min   GFR calc Af Amer >60 >60 mL/min    Comment: (NOTE) The eGFR has been calculated using the CKD EPI equation. This calculation has not been validated in all clinical situations. eGFR's persistently <60 mL/min signify possible Chronic Kidney Disease.   Basic metabolic panel     Status: Abnormal   Collection Time: 12/06/14  4:37 AM  Result Value Ref Range   Sodium 133 (L) 135 - 145 mmol/L   Potassium 3.7 3.5 - 5.1 mmol/L   Chloride 99 (L) 101 - 111 mmol/L   CO2 27 22 - 32 mmol/L   Glucose, Bld 303 (H) 65 - 99 mg/dL   BUN 13 6 - 20 mg/dL   Creatinine, Ser 0.74 0.61 - 1.24 mg/dL   Calcium 8.1 (L) 8.9 - 10.3 mg/dL   GFR calc non Af Amer >60 >60 mL/min   GFR calc Af Amer >60 >60 mL/min    Comment: (NOTE) The eGFR has been calculated using the CKD EPI equation. This calculation has not been validated in all clinical situations. eGFR's persistently <60 mL/min signify possible Chronic Kidney Disease.    Anion gap 7 5 - 15  CBC     Status: Abnormal   Collection Time: 12/06/14  4:37 AM  Result Value Ref Range   WBC 15.7 (H) 3.8 - 10.6 K/uL   RBC 4.18 (L) 4.40 - 5.90 MIL/uL   Hemoglobin 13.3 13.0 - 18.0 g/dL   HCT 38.9 (L) 40.0 - 52.0 %   MCV 93.2 80.0 - 100.0 fL   MCH 31.8 26.0 - 34.0  pg   MCHC 34.1 32.0 - 36.0 g/dL   RDW 12.7 11.5 - 14.5 %   Platelets 236 150 - 440 K/uL  Hemoglobin A1c     Status: Abnormal   Collection Time: 12/06/14  4:37 AM  Result Value Ref Range   Hgb A1c MFr Bld 11.2 (H) 4.0 - 6.0 %  Vancomycin, trough     Status: Abnormal   Collection Time: 12/06/14  4:37 AM  Result Value Ref Range   Vancomycin Tr 6 (L) 10 - 20 ug/mL  Glucose, capillary     Status: Abnormal   Collection Time: 12/06/14  8:26 AM  Result Value Ref Range   Glucose-Capillary 237 (H) 65 - 99 mg/dL   Comment 1 Notify RN    Comment 2  Document in Chart   Glucose, capillary     Status: Abnormal   Collection Time: 12/06/14 11:09 AM  Result Value Ref Range   Glucose-Capillary 295 (H) 65 - 99 mg/dL   Comment 1 Notify RN    Comment 2 Document in Chart   Glucose, capillary     Status: Abnormal   Collection Time: 12/06/14  4:49 PM  Result Value Ref Range   Glucose-Capillary 208 (H) 65 - 99 mg/dL   Comment 1 Notify RN    Comment 2 Document in Chart   Glucose, capillary     Status: Abnormal   Collection Time: 12/06/14  9:50 PM  Result Value Ref Range   Glucose-Capillary 495 (H) 65 - 99 mg/dL  Glucose, capillary     Status: Abnormal   Collection Time: 12/07/14  7:13 AM  Result Value Ref Range   Glucose-Capillary 195 (H) 65 - 99 mg/dL  Glucose, capillary     Status: Abnormal   Collection Time: 12/07/14 11:20 AM  Result Value Ref Range   Glucose-Capillary 226 (H) 65 - 99 mg/dL  Vancomycin, trough     Status: None   Collection Time: 12/07/14 11:42 AM  Result Value Ref Range   Vancomycin Tr 14 10 - 20 ug/mL  Glucose, capillary     Status: Abnormal   Collection Time: 12/07/14  4:25 PM  Result Value Ref Range   Glucose-Capillary 146 (H) 65 - 99 mg/dL    Vitals: Blood pressure 145/70, pulse 72, temperature 97.7 F (36.5 C), temperature source Oral, resp. rate 20, height 6' 3" (1.905 m), weight 122.063 kg (269 lb 1.6 oz), SpO2 96 %.  Risk to Self: Is patient at risk for suicide?: No Risk to Others:   Prior Inpatient Therapy:   Prior Outpatient Therapy:    Current Facility-Administered Medications  Medication Dose Route Frequency Provider Last Rate Last Dose  . acetaminophen (TYLENOL) tablet 650 mg  650 mg Oral Q6H PRN Nicholes Mango, MD       Or  . acetaminophen (TYLENOL) suppository 650 mg  650 mg Rectal Q6H PRN Nicholes Mango, MD      . alum & mag hydroxide-simeth (MAALOX/MYLANTA) 200-200-20 MG/5ML suspension 30 mL  30 mL Oral Q6H PRN Nicholes Mango, MD      . aspirin EC tablet 81 mg  81 mg Oral Daily Nicholes Mango, MD   81  mg at 12/07/14 1037  . docusate sodium (COLACE) capsule 100 mg  100 mg Oral Daily PRN Nicholes Mango, MD      . heparin injection 5,000 Units  5,000 Units Subcutaneous 3 times per day Nicholes Mango, MD   5,000 Units at 12/05/14 2336  . HYDROcodone-acetaminophen (NORCO/VICODIN) 5-325 MG per  tablet 1-2 tablet  1-2 tablet Oral Q6H PRN Nicholes Mango, MD   1 tablet at 12/07/14 1214  . insulin aspart (novoLOG) injection 0-20 Units  0-20 Units Subcutaneous TID WC Fritzi Mandes, MD   7 Units at 12/07/14 1208  . insulin glargine (LANTUS) injection 20 Units  20 Units Subcutaneous Daily Fritzi Mandes, MD   20 Units at 12/07/14 1120  . metoprolol tartrate (LOPRESSOR) tablet 25 mg  25 mg Oral BID Nicholes Mango, MD   25 mg at 12/07/14 1037  . nicotine (NICODERM CQ - dosed in mg/24 hours) patch 21 mg  21 mg Transdermal Daily Nicholes Mango, MD   21 mg at 12/06/14 1000  . ondansetron (ZOFRAN) tablet 4 mg  4 mg Oral Q6H PRN Nicholes Mango, MD       Or  . ondansetron (ZOFRAN) injection 4 mg  4 mg Intravenous Q6H PRN Aruna Gouru, MD      . piperacillin-tazobactam (ZOSYN) IVPB 3.375 g  3.375 g Intravenous 3 times per day Fritzi Mandes, MD   3.375 g at 12/07/14 1316  . vancomycin (VANCOCIN) 1,500 mg in sodium chloride 0.9 % 500 mL IVPB  1,500 mg Intravenous Q8H Jody C Barefoot, RPH        Musculoskeletal: Strength & Muscle Tone: decreased Gait & Station: unsteady Patient leans: N/A  Psychiatric Specialty Exam: Physical Exam  Constitutional: He is oriented to person, place, and time. He appears distressed.  HENT:  Head: Normocephalic.  Eyes: Pupils are equal, round, and reactive to light.  Neck: Normal range of motion.  Respiratory: Effort normal.  Musculoskeletal: He exhibits edema and tenderness.  Neurological: He is alert and oriented to person, place, and time.  Skin: There is erythema.  Psychiatric: His speech is normal and behavior is normal. His affect is blunt. Thought content is paranoid. Cognition and memory are normal.  He expresses inappropriate judgment.    Review of Systems  Constitutional: Positive for malaise/fatigue.  HENT: Negative.   Eyes: Negative.   Respiratory: Positive for shortness of breath.   Cardiovascular: Positive for leg swelling.  Gastrointestinal: Negative.   Musculoskeletal: Positive for back pain.  Skin: Positive for rash.  Neurological: Positive for sensory change, focal weakness and weakness.  Psychiatric/Behavioral: Positive for depression. Negative for suicidal ideas, hallucinations, memory loss and substance abuse. The patient is not nervous/anxious and does not have insomnia.     Blood pressure 145/70, pulse 72, temperature 97.7 F (36.5 C), temperature source Oral, resp. rate 20, height 6' 3" (1.905 m), weight 122.063 kg (269 lb 1.6 oz), SpO2 96 %.Body mass index is 33.64 kg/(m^2).  General Appearance: Fairly Groomed  Engineer, water::  Good  Speech:  Normal Rate  Volume:  Normal  Mood:  Anxious  Affect:  Restricted  Thought Process:  Goal Directed, Linear and Logical  Orientation:  Full (Time, Place, and Person)  Thought Content:  Negative  Suicidal Thoughts:  No  Homicidal Thoughts:  No  Memory:  Immediate;   Good Recent;   Good Remote;   Good  Judgement:  Intact  Insight:  Fair  Psychomotor Activity:  Decreased  Concentration:  Good  Recall:  Good  Fund of Knowledge:Good  Language: Good  Akathisia:  No  Handed:  Right  AIMS (if indicated):     Assets:  Desire for Improvement Financial Resources/Insurance  ADL's:  Intact  Cognition: WNL  Sleep:      Medical Decision Making: Established Problem, Stable/Improving (1), Review of Psycho-Social Stressors (1), Review or  order clinical lab tests (1), Discuss test with performing physician (1) and Review and summation of old records (2)  Treatment Plan Summary: Plan After my interview with the patient my impression is that 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 During our  conversation he had multiple complaints about how the government and other authorities had treated him badly. He seems to have no problem making decisions that are not in his best interest if he thinks that somebody is manipulating him or pushing him around. On the other hand he does not appear to have any symptoms of psychosis. He denies any suicidal ideation. He has a low mood but otherwise does not appear to have symptoms of major depression. I don't think that adding an antidepressant medicine and would be of enough reliable help to justify the extra burden of another medication for him. If he can and needs to try and take care of his medical problems first. At any antidepressant or other psychiatric medicine. Supportive counseling done. I tried to find out if there was anything that social work could do to be of help to him and he thinks that if he got some assistance looking for a place to live he would appreciate it. If social work has not ordered he met with him I will ask them to do so. I will follow as needed.  Plan:  No evidence of imminent risk to self or others at present.   Patient does not meet criteria for psychiatric inpatient admission. Supportive therapy provided about ongoing stressors. Disposition: No need for psychiatric hospitalization. Will not initiate any psychiatric medicine. Follow-up as needed  Alethia Berthold 12/07/2014 5:37 PM

## 2014-12-07 NOTE — Consult Note (Signed)
Surgery Consult Note  CC: Bilateral buttock pressure ulcers  HPI: [name] is a pleasant 64 yo M who presented on 5/17 with generalized weakness.  Multiple medical issues.  Per his story, he has developed bilateral nonhealing buttock ulcers after being in a wheelchair without moving for a significant amount of time.  + pain.  Has been treated with broad spectum antibiotics.  Has not been taking his meds.   Otherwise no fevers, chills, night sweats, shortness of breath, cough, chest pain, nausea/vomiting, diarrhea/constipation, dysuria/hematuria.    Active Ambulatory Problems    Diagnosis Date Noted  . No Active Ambulatory Problems   Resolved Ambulatory Problems    Diagnosis Date Noted  . No Resolved Ambulatory Problems   Past Medical History  Diagnosis Date  . Hypertension   . Diabetes mellitus without complication   . CHF (congestive heart failure)   . MRSA (methicillin resistant staph aureus) culture positive   . MI (mitral incompetence)   . Diabetic nephropathy   . TIA (transient ischemic attack)    Scheduled Meds: . aspirin EC  81 mg Oral Daily  . heparin  5,000 Units Subcutaneous 3 times per day  . insulin aspart  0-20 Units Subcutaneous TID WC  . insulin glargine  20 Units Subcutaneous Daily  . metoprolol tartrate  25 mg Oral BID  . nicotine  21 mg Transdermal Daily  . piperacillin-tazobactam (ZOSYN)  IV  3.375 g Intravenous 3 times per day  . vancomycin  1,500 mg Intravenous Q8H   Continuous Infusions:  PRN Meds:.acetaminophen **OR** acetaminophen, alum & mag hydroxide-simeth, docusate sodium, HYDROcodone-acetaminophen, ondansetron **OR** ondansetron (ZOFRAN) IV   History reviewed. No pertinent family history.   History   Social History  . Marital Status: Divorced    Spouse Name: N/A  . Number of Children: N/A  . Years of Education: N/A   Occupational History  . Not on file.   Social History Main Topics  . Smoking status: Current Every Day Smoker -- 1.00  packs/day  . Smokeless tobacco: Not on file  . Alcohol Use: No  . Drug Use: Not on file  . Sexual Activity: Not on file   Other Topics Concern  . Not on file   Social History Narrative  . No narrative on file   Review of systems; Complete ROS obtained, pertinent positives and negatives as above.  Allergies Bee venom and Cinnamon   Blood pressure 145/70, pulse 72, temperature 97.7 F (36.5 C), temperature source Oral, resp. rate 20, height 6\' 3"  (1.905 m), weight 122.063 kg (269 lb 1.6 oz), SpO2 96 %.  GEN: NAD/A&ox3 HEAD: [place]/AT FACE: no obvious facial trauma, normal external ears and nose EYES: No scleral icterus, no conjunctivitis CHEST: Clear to auscultation, moving air well CV: RRR, no MRG ABD: soft, nontender EXT: bilateral buttock desquamation, no obvious fluctuance, areas approx 4 x 4 cm  Labs WBC  15.7 yesterday, BG most recent 226  Imaging: none  A/P 64 yo admitted for weakness, medical noncompliance, leukocytosis, with bilateral buttock ulcers.  May be full thickness but will likely form eschars, which I feel is like a biological wound dressing.  Fear with debridement is that it will cause open wounds which will never heal and which will require intesive wound care, which would be very difficult in his present housing condition.  Recommend no debridement at this time but rather wound care consult.

## 2014-12-07 NOTE — Progress Notes (Signed)
[facility] [place] at [facility]   PATIENT NAME: Nathan Caldwell    MR#:  [id number]  DATE OF BIRTH:  26-Mar-1951  SUBJECTIVE:  Came in with bilateral sacral nonhealing ulcers. Homeless  Patient is frustrated as he was given bread toast for breakfast, reports he is allergic to cinnamon  REVIEW OF SYSTEMS:    ROS Constitutional: Positive for malaise/fatigue. Negative for fever, chills and weight loss.  HENT: Negative for ear discharge, ear pain and nosebleeds.  Eyes: Negative for blurred vision, pain and discharge.  Respiratory: Negative for sputum production, shortness of breath, wheezing and stridor.  Cardiovascular: Negative for chest pain, palpitations, orthopnea and PND.  Gastrointestinal: Negative for nausea, vomiting, abdominal pain and diarrhea.  Genitourinary: Negative for urgency and frequency.  Musculoskeletal: Positive for back pain. Negative for joint pain.  Skin: Positive for rash.  Neurological: Positive for weakness. Negative for sensory change, speech change and focal weakness.  Psychiatric/Behavioral: Positive for hallucinations. Negative for depression. The patient is not nervous/anxious.  large bilateral about 5x5 cm foulsmlling ulcers chronic over the buttocks with 100% eschar over it  Tolerating Diet:yes Tolerating PT: NO, Wheelchair bound  DRUG ALLERGIES:   Allergies  Allergen Reactions  . Bee Venom   . Cinnamon     VITALS:  Blood pressure 145/70, pulse 72, temperature 97.7 F (36.5 C), temperature source Oral, resp. rate 20, height 6\' 3"  (1.905 m), weight 122.063 kg (269 lb 1.6 oz), SpO2 96 %.  PHYSICAL EXAMINATION:   Physical Exam  GENERAL:  64 y.o.-year-old patient lying in the bed with no acute distress.  EYES: Pupils equal, round, reactive to light and accommodation. No scleral icterus. Extraocular muscles intact.  HEENT: Head atraumatic, normocephalic. Oropharynx and nasopharynx clear.  NECK:  Supple, no  jugular venous distention. No thyroid enlargement, no tenderness.  LUNGS: Normal breath sounds bilaterally, no wheezing, rales, rhonchi. No use of accessory muscles of respiration.  CARDIOVASCULAR: S1, S2 normal. No murmurs, rubs, or gallops.  ABDOMEN: Soft, nontender, nondistended. Bowel sounds present. No organomegaly or mass.  EXTREMITIES: No cyanosis, clubbing or edema b/l.    NEUROLOGIC: Cranial nerves II through XII are intact. No focal Motor or sensory deficits b/l.   PSYCHIATRIC: The patient is alert and oriented x 3.  SKIN:large bilateral about 5x5 cm foulsmlling ulcers chronic over the buttocks with 100% eschar over i  LABORATORY PANEL:   CBC  Recent Labs Lab 12/06/14 0437  WBC 15.7*  HGB 13.3  HCT 38.9*  PLT 236   ------------------------------------------------------------------------------------------------------------------  Chemistries   Recent Labs Lab 12/05/14 1636  12/06/14 0437  NA 129*  --  133*  K 3.6  --  3.7  CL 93*  --  99*  CO2 26  --  27  GLUCOSE 307*  --  303*  BUN 13  --  13  CREATININE 0.86  < > 0.74  CALCIUM 8.2*  --  8.1*  AST 16  --   --   ALT 11*  --   --   ALKPHOS 63  --   --   BILITOT 0.9  --   --   < > = values in this interval not displayed. ------------------------------------------------------------------------------------------------------------------  Cardiac Enzymes  Recent Labs Lab 12/05/14 1636  TROPONINI 0.03   ------------------------------------------------------------------------------------------------------------------  RADIOLOGY:  Dg Chest Port 1 View  12/05/2014   CLINICAL DATA:  Hyperglycemia.  EXAM: PORTABLE CHEST - 1 VIEW  COMPARISON:  CT chest 07/12/2013  FINDINGS: The heart size and mediastinal  contours are within normal limits. Both lungs are clear. The visualized skeletal structures are unremarkable.  IMPRESSION: No active disease.   Electronically Signed   By: Elige KoHetal  Patel   On: 12/05/2014 15:41      ASSESSMENT AND PLAN:    #1 sepsis-from infected sacral decub Ulcers patient meets septic criteria with leukocytosis, sinus tachycardia at the time of admission Blood cultures and wound cultures are ordered -IV vanc and zosyn -Surgical consult is placed for possible debridement  #2 diabetes mellitus type 2-uncontrolled; patient is currently hyperglycemic Patient has stopped taking medications for the past 5-6 weeks -Insulin Lantus and high dose ssi, long-acting insulin needs to be changed to 70/30 at the time of discharge which is less expensive -compliance is going to be a chronic issue given his financial situation  #3 hypertension-blood pressure is elevated next and patient stopped taking medications for the past 5-6 weeks  on metoprolol 25 mg by mouth twice a day   #4 pseudohyponatremia secondary to hyperglycemia               Provided gentle hydration with IV fluids , sodium is better at 133 yesterday  #5 chronic history of congestive heart failure  currently not fluid overloaded, not on any home medications.  #6 chronic atrial fibrillation-not on any anticoagulation Rate controlled on aspirin 81 mg daily  #7 medical noncompliance and homeless Follow-up with Case management  #8 tobacco abuse-counseled patient to quit smoking for 3-5 minutes. We will continue  nicotine patch. Patient is agreeable to quit smoking.  Case discussed with Care Management/Social Worker. Management plans discussed with the patient, family and they are in agreement.  CODE STATUS: full  DVT Prophylaxis: heparin  TOTAL TIME TAKING CARE OF THIS PATIENT: 30 minutes.   POSSIBLE D/C IN 3-4 DAYS, DEPENDING ON CLINICAL CONDITION.   Ramonita LabGouru, Gracianna Vink M.D on 12/07/2014 at 3:01 PM  Between 7am to 6pm - Pager - [phone]  After 6pm go to [email or web address] - password [email or web address] [facility]  [facility]  Office  [phone]  CC: Primary care physician; No PCP Per Patient

## 2014-12-07 NOTE — Plan of Care (Signed)
Problem: Discharge Progression Outcomes Goal: Discharge plan in place and appropriate Individualization:  Outcome: Progressing Individualization: Pt prefers to be called Nathan Caldwell Hx CHF, DM, HTN, AFib, & Neuropathy which are not controlled by home medications currently. Pt is homeless and has been unable to obtain medications for the last 4-6 weeks. Pt is alert but very lethargic with generalized increased weakness but uses a wheelchair at baseline Pt is a high fall risk and understands how to utilize the call system for assistance  Goal: Other Discharge Outcomes/Goals Outcome: Progressing Plan of care progress to goa Patient complains of back/buttocks pain. Medication given x2 with relief. Pt refuses his heparin shot. Patient remains on telemetry - NSR HR 70's. Patient turns self q2 hrs to keep off of wound. High fall risk - bed alarm on and room near nurses station. Isolation precautions remain for MRSA, ESBL and VRE.

## 2014-12-07 NOTE — Progress Notes (Signed)
ANTIBIOTIC CONSULT NOTE - FOLLOW UP  Pharmacy Consult for Vancomycin/Zosyn Indication: rule out sepsis  Allergies  Allergen Reactions  . Bee Venom   . Cinnamon     Patient Measurements: Height: 6\' 3"  (190.5 cm) Weight: 269 lb 1.6 oz (122.063 kg) IBW/kg (Calculated) : 84.5 Adjusted Body Weight:   Vital Signs: Temp: 97.5 F (36.4 C) (05/19 0444) Temp Source: Oral (05/19 0444) BP: 126/74 mmHg (05/19 0444) Pulse Rate: 73 (05/19 0444) Intake/Output from previous day: 05/18 0701 - 05/19 0700 In: 300 [IV Piggyback:300] Out: 1050 [Urine:1050] Intake/Output from this shift: Total I/O In: 300 [IV Piggyback:300] Out: 600 [Urine:600]  Labs:  Recent Labs  12/05/14 1636 12/05/14 2320 12/06/14 0437  WBC 21.0* 18.4* 15.7*  HGB 13.8 13.2 13.3  PLT 248 238 236  CREATININE 0.86 0.80 0.74   Estimated Creatinine Clearance: 131.3 mL/min (by C-G formula based on Cr of 0.74).  Recent Labs  12/06/14 0437 12/07/14 1142  VANCOTROUGH 6* 14     Microbiology: Recent Results (from the past 720 hour(s))  Urine culture     Status: None (Preliminary result)   Collection Time: 12/05/14  4:13 PM  Result Value Ref Range Status   Specimen Description URINE, CLEAN CATCH  Final   Special Requests NONE  Final   Culture   Final    80,000 COLONIES/ml YEAST IDENTIFICATION TO FOLLOW    Report Status PENDING  Incomplete  Culture, blood (routine x 2)     Status: None (Preliminary result)   Collection Time: 12/05/14  9:32 PM  Result Value Ref Range Status   Specimen Description BLOOD  Final   Special Requests NONE  Final   Culture NO GROWTH 2 DAYS  Final   Report Status PENDING  Incomplete  Culture, blood (routine x 2)     Status: None (Preliminary result)   Collection Time: 12/05/14  9:32 PM  Result Value Ref Range Status   Specimen Description BLOOD  Final   Special Requests NONE  Final   Culture  Setup Time   Final    GRAM POSITIVE COCCI IN CHAINS IN BOTH AEROBIC AND ANAEROBIC  BOTTLES IDENTIFICATION TO FOLLOW CRITICAL RESULT CALLED TO, READ BACK BY AND VERIFIED WITH: SHAY BREWER AT 1455 CTJ CRITICAL VALUE NOTED.  VALUE IS CONSISTENT WITH PREVIOUSLY REPORTED AND CALLED VALUE. TSH 12/06/14.    Culture   Final    GRAM POSITIVE COCCI IN BOTH AEROBIC AND ANAEROBIC BOTTLES IDENTIFICATION TO FOLLOW    Report Status PENDING  Incomplete  Wound culture     Status: None (Preliminary result)   Collection Time: 12/05/14 11:10 PM  Result Value Ref Range Status   Specimen Description SACRAL  Final   Special Requests NONE  Final   Gram Stain   Final    RARE WBC SEEN FEW GRAM POSITIVE COCCI IN PAIRS RARE GRAM NEGATIVE RODS    Culture   Final    LIGHT GROWTH GRAM NEGATIVE RODS IDENTIFICATION TO FOLLOW SUSCEPTIBILITIES TO FOLLOW    Report Status PENDING  Incomplete  Wound culture     Status: None (Preliminary result)   Collection Time: 12/05/14 11:10 PM  Result Value Ref Range Status   Specimen Description ARM  Final   Special Requests SWAB  Final   Gram Stain   Final    RARE WBC SEEN MODERATE GRAM POSITIVE COCCI IN PAIRS IN SINGLES FEW GRAM POSITIVE RODS FEW GRAM NEGATIVE RODS    Culture   Final    MODERATE GROWTH GRAM  NEGATIVE RODS LIGHT GROWTH GRAM NEGATIVE RODS IDENTIFICATION TO FOLLOW SUSCEPTIBILITIES TO FOLLOW    Report Status PENDING  Incomplete    Anti-infectives    Start     Dose/Rate Route Frequency Ordered Stop   12/07/14 2000  vancomycin (VANCOCIN) 1,500 mg in sodium chloride 0.9 % 500 mL IVPB     1,500 mg 250 mL/hr over 120 Minutes Intravenous Every 8 hours 12/07/14 1347     12/06/14 1100  piperacillin-tazobactam (ZOSYN) IVPB 3.375 g     3.375 g 12.5 mL/hr over 240 Minutes Intravenous 3 times per day 12/06/14 1033     12/06/14 0400  vancomycin (VANCOCIN) 1,250 mg in sodium chloride 0.9 % 250 mL IVPB  Status:  Discontinued     1,250 mg 166.7 mL/hr over 90 Minutes Intravenous Every 8 hours 12/05/14 2314 12/07/14 1347   12/05/14 1915   vancomycin (VANCOCIN) IVPB 1000 mg/200 mL premix     1,000 mg 200 mL/hr over 60 Minutes Intravenous  Once 12/05/14 1903 12/06/14 2206      Assessment: Patient is a 64 yo male admitted with possible sepsis.  Patient with infected sacral decub ulcers and receiving treatment with IV Vancomycin/Zosyn. GPC/GNR in wounds on arm and sacrum. GPCs in blood cultures.  Trough 5/19 @ 11:42 = 6614mcg/ml  Goal of Therapy:  Goal Vancomycin trough of 15-20   Plan:  Current orders for Vancomycin 1250 mg IV q8h.   Previous vancomycin dose given ~ 2 hours late, so true trough is lower than 14. Will increase dose to vancomycin 1500mg  IV Q8H.  Trough ordered prior to 5th dose 5/21 at 330. Will monitor closely for accumulation of drug in this patient due to obesity.   Will continue Zosyn 3.375 gm IV q8h  Pharmacy will continue to follow.  Garlon HatchetJody Rapheal Masso, PharmD Clinical Pharmacist 12/07/2014 1:52 PM

## 2014-12-07 NOTE — Care Management (Addendum)
[facility] has agreed to seeing this patient again under the conditions that his behavior is controlled. This RNCM requested psych consult for evaluation. Please refer to DM nurses note related to insulin suggestions. Application for [facility] and medication management placed in front of patients physical chart. RNCM please make sure patient receives both at time of discharge.

## 2014-12-07 NOTE — Clinical Social Work Note (Signed)
Clinical Social Work Assessment  Patient Details  Name: Nathan Caldwell MRN: [id number] Date of Birth: 05/12/1951  Date of referral:  12/06/14               Reason for consult:  Facility Placement, Housing Concerns/Homelessness                Permission sought to share information with:  Family Supports Permission granted to share information::  Yes, Verbal Permission Granted  Name::        Agency::     Relationship::   Daughter  Contact Information:     Housing/Transportation Living arrangements for the past 2 months:  Homeless, Hotel/Motel, Apartment Source of Information:  Patient, Adult Children Patient Interpreter Needed:  None Criminal Activity/Legal Involvement Pertinent to Current Situation/Hospitalization:  No - Comment as needed Significant Relationships:  Adult Children Lives with:  Other (Comment) (homless, lived alone prior to losing housing) Do you feel safe going back to the place where you live?  No Need for family participation in patient care:  Yes (Comment)  Care giving concerns:  Pt needs more support than what his family is able to do. Pt is currently homeless, making any care giving issues even harder. Up until Pt was sleeping on the street for the last week, pt's daughter, exwife, and high school granddaughter would check in on him and assist as needed.    Social Worker assessment / plan:  Holiday representative met with Pt in his room. Pt reported that he is divorce, has 2 adult children and a couple grandchildren who he is close to. He shared that he is disabled after years of working a variety of jobs but mostly working in a [employer]. 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 Pt reports that he was fired from his job at [employer] after many years because it was cheaper for the company to hire someone newer and younger to do the same job. Pt has been on disability for a little over a year after several  years of trying to get on disability. Pt was living in income based housing for 7 years up until April when he stated that he had to move because he did not adhere to a no smoking policy.  Pt has supportive friends in the community who have loaned him money to stay in a hotel to stay off of the street. Pt states that if he paid them back at this point, he would still have no money for housing. CSW and Pt discussed possible placement in an ALF or SNF and Pt is agreeable. He is concerned with his wounds and that he has worsened.  PT consult pending, CSW will continue to follow up to assist with dc planning.   Employment status:  Disabled (Comment on whether or not currently receiving Disability) Insurance information:  Self Pay (Medicaid Pending) PT Recommendations:  Not assessed at this time Information / Referral to community resources:  [place]  Patient/Family's Response to care:  Pt's family supportive of Pt, though limited in how they can support him with housing and medical care. Pt's daughter and exwife were going to go to DSS to apply for medicaid if possible. Pt stated that he agrees with placement but would prefer to stay as close as possible to [place] with his grandchildren.   Patient/Family's Understanding of and Emotional Response to Diagnosis, Current Treatment, and Prognosis:  Pt tearful  at when talking about having to leave [facility] for treatment. Pt's family supportive and appropriately concerned for Pt.   Emotional Assessment Appearance:  Appears older than stated age, Disheveled Attitude/Demeanor/Rapport:  Complaining Affect (typically observed):  Frustrated, Overwhelmed Orientation:  Oriented to Self, Oriented to Place, Oriented to  Time, Oriented to Situation Alcohol / Substance use:  Never Used Psych involvement (Current and /or in the community):  No (Comment)  Discharge Needs  Concerns to be addressed:  Adjustment to Illness, Coping/Stress Concerns,  Compliance Issues Concerns, Discharge Planning Concerns, Homelessness, Mental Health Concerns, Financial / Insurance Concerns Readmission within the last 30 days:  No Current discharge risk:  Homeless, Chronically ill Barriers to Discharge:  Homeless with medical needs, Continued Medical Work up   [employer], LCSW 12/07/2014, 9:40 PM

## 2014-12-07 NOTE — Plan of Care (Signed)
Problem: Discharge Progression Outcomes Goal: Discharge plan in place and appropriate Individualization:  Outcome: Progressing    Individualization of Care:   Patient prefers to be called Nathan Caldwell. History of DM, HTN, AFib, & Neuropathy which are not controlled by home medications currently. Patient is homeless and has been unable to obtain medications for the last 4-6 weeks. Patient is alert but very lethargic with generalized increased weakness but uses a wheelchair at baseline.   Patient is a high fall risk and understands how to utilize the call system for assistance.          Goal: Other Discharge Outcomes/Goals Outcome: Progressing Plan of care progress to goal:  Patient complained of low back pain, pain medication given with relief.  Patient remains on telemetry - NSR Hr 68. Patient is now on specialty bed. Isolation precautions remain for MRSA, ESBL and VRE.

## 2014-12-07 NOTE — Clinical Social Work Placement (Signed)
   CLINICAL SOCIAL WORK PLACEMENT  NOTE  Date:  12/07/2014  Patient Details  Name: Nathan GlaserStephen Sudbury MRN: [id number] Date of Birth: 27-May-1951  Clinical Social Work is seeking post-discharge placement for this patient at the Skilled  Nursing Facility level of care (*CSW will initial, date and re-position this form in  chart as items are completed):  Yes   Patient/family provided with [place] Clinical Social Work Department's list of facilities offering this level of care within the geographic area requested by the patient (or if unable, by the patient's family).  Yes   Patient/family informed of their freedom to choose among providers that offer the needed level of care, that participate in Medicare, Medicaid or managed care program needed by the patient, have an available bed and are willing to accept the patient.  Yes   Patient/family informed of 's ownership interest in [facility] and [facility], as well as of the fact that they are under no obligation to receive care at these facilities.  PASRR submitted to EDS on       PASRR number received on       Existing PASRR number confirmed on 12/07/14     FL2 transmitted to all facilities in geographic area requested by pt/family on 12/07/14     FL2 transmitted to all facilities within larger geographic area on       Patient informed that his/her managed care company has contracts with or will negotiate with certain facilities, including the following:            Patient/family informed of bed offers received.  Patient chooses bed at       Physician recommends and patient chooses bed at      Patient to be transferred to   on  .  Patient to be transferred to facility by       Patient family notified on   of transfer.  Name of family member notified:        PHYSICIAN       Additional Comment:  LOG  _______________________________________________ Dede QuerySarah Kadance Mccuistion, LCSW 12/07/2014, 3:45 PM

## 2014-12-07 NOTE — Progress Notes (Signed)
I spoke to the patient this morning- I discussed the need to assess insulin types in anticipation of discharge (because of cost).   He is familiar with Lantus and Novolog because this is what he's used in the past but the cost to the patient is very high (over $100.00/bottle).  He has previously been getting Lantus and Novolog from [facility].  Depending on the type of coverage this patient has and his discharge plans, we need to consider changing him over to Novolin 70/30 insulin on discharge because if he had to pay for it out of pocket, it would [facility].  I will continue to work with the Child psychotherapistsocial worker and MD.  Susette RacerJulie Lonnie Rosado, RN, BA, MHA, CDE Diabetes Coordinator Inpatient Diabetes Program  [phone] (Team Pager) [phone] [facility] Office) 12/07/2014 12:42 PM

## 2014-12-07 NOTE — Progress Notes (Signed)
Physical Therapy Treatment Patient Details Name: Nathan Caldwell MRN: [id number] DOB: 01/02/1951 Today's Date: 12/07/2014    History of Present Illness Pt is a 64yo white Male who presented to the ED after reports of significant lethargy and fatigue.  Pt found to have bilat sacral ulcers upon admission. Pt has been homeless and 'living in the streets' for the last ~6 weeks, and using a motorized W/C for mobility. Pt reports that he began to experience increased falls back in Jan/Feb at which point he acquired an electric W/C from a friend. Pt describes rapid loss of motor function in LLE, with paresthesia and alodynia.     PT Comments    Pt is A&Ox4 upon arrival. Pt is conversational and agreeable to participating with PT, but is at baseline very upset at most things discussed, thus making difficult to ascertain the patient's priorities.Additional time was spent with pt education regardign weight shifting techniques for pressure relief of sacral/ischial area, as well as regular skin checks on LLE. Pt response did not seem very engaged, with occasional fatalistic comments such as "I'm gonna die anyway..." and "they might as well cut this thing off." Pt expresses distress by current social situation and expresses minimal self efficacy when it comes to providing self care. Upon removal of sheets and blanket for prep to transfer, PT noticed that tape at IV site was no longer adherent to skin due to blood saturation. Line was taped back in place, at a site slightly more distal where skin was found to be dry, and RN was notified of findings. Patient presents with impairment of strength, pain, range of motion, and activity tolerance, limiting ability to perform ADL, IADL, and ambulation. Patient will benefit from skilled intervention to address the above impairments and limitations, in order to restore to prior level of function and to decrease caregiver burden.    Follow Up Recommendations  Supervision for  mobility/OOB     Equipment Recommendations  Wheelchair (measurements PT);Wheelchair cushion (measurements PT)    Recommendations for Other Services       Precautions / Restrictions Precautions Precautions: Fall Restrictions Weight Bearing Restrictions: No    Mobility  Bed Mobility Overal bed mobility: Needs Assistance Bed Mobility: Supine to Sit Rolling:  (Not performed this session.)   Supine to sit: Supervision (Very high effort, with DOE and multiple breaks to recover. )        Transfers Overall transfer level: Needs assistance Equipment used: None Transfers: Lateral/Scoot Transfers          Lateral/Scoot Transfers: Supervision General transfer comment: decreased arm strength today, making it difficult to land atop pillow. Required assistance to place pillow while he raised up.   Ambulation/Gait                 Stairs            Wheelchair Mobility    Modified Rankin (Stroke Patients Only)       Balance Overall balance assessment: Modified Independent                                  Cognition Arousal/Alertness: Awake/alert Behavior During Therapy: Anxious;WFL for tasks assessed/performed Overall Cognitive Status: Within Functional Limits for tasks assessed       Memory:  (Able to recall most of education from previous session. )              Exercises  General Comments        Pertinent Vitals/Pain      Home Living Family/patient expects to be discharged to:: Shelter/Homeless               Additional Comments: Nathan Bibleat was living at a resthome up until early Spring, when he left due to a dispute over lease and smoking, and lived in an extended stay hotel for a while before he ran short of funds. Pt claims he has no family locally that he can depend on for help.     Prior Function Level of Independence: Independent with assistive device(s)  Gait / Transfers Assistance Needed: Was performing lateral  scooting transfers ModI. Unable to perform ambulation due to LLE paresis x 3 months.  ADL's / Homemaking Assistance Needed: N/A      PT Goals (current goals can now be found in the care plan section) Acute Rehab PT Goals Patient Stated Goal: Find a new place to rent with his disability money.  PT Goal Formulation: Patient unable to participate in goal setting Time For Goal Achievement: 12/20/14 Potential to Achieve Goals: Fair Progress towards PT goals: Not progressing toward goals - comment    Frequency  Min 2X/week    PT Plan Current plan remains appropriate    Co-evaluation             End of Session   Activity Tolerance: Patient limited by lethargy;Patient limited by pain;Patient tolerated treatment well Patient left: in chair;with call bell/phone within reach     Time: 1506-1529 PT Time Calculation (min) (ACUTE ONLY): 23 min  Charges:  $Therapeutic Activity: 23-37 mins                    G Codes:      Caldwell,Nathan C 12/07/2014, 3:44 PM  Nathan LintsAllan C Caldwell, PT, DPT

## 2014-12-08 LAB — GLUCOSE, CAPILLARY
GLUCOSE-CAPILLARY: 228 mg/dL — AB (ref 65–99)
Glucose-Capillary: 176 mg/dL — ABNORMAL HIGH (ref 65–99)
Glucose-Capillary: 243 mg/dL — ABNORMAL HIGH (ref 65–99)
Glucose-Capillary: 176 mg/dL — ABNORMAL HIGH (ref 65–99)
Glucose-Capillary: 220 mg/dL — ABNORMAL HIGH (ref 65–99)

## 2014-12-08 LAB — BASIC METABOLIC PANEL
ANION GAP: 5 (ref 5–15)
BUN: 10 mg/dL (ref 6–20)
CHLORIDE: 99 mmol/L — AB (ref 101–111)
CO2: 28 mmol/L (ref 22–32)
Calcium: 8.4 mg/dL — ABNORMAL LOW (ref 8.9–10.3)
Creatinine, Ser: 0.64 mg/dL (ref 0.61–1.24)
GFR calc Af Amer: >60 mL/min (ref >60)
GFR calc non Af Amer: >60 mL/min (ref >60)
Glucose, Bld: 195 mg/dL — ABNORMAL HIGH (ref 65–99)
POTASSIUM: 3.6 mmol/L (ref 3.5–5.1)
SODIUM: 132 mmol/L — AB (ref 135–145)

## 2014-12-08 LAB — CBC
HCT: 39.2 % — ABNORMAL LOW (ref 40.0–52.0)
HEMOGLOBIN: 13.2 g/dL (ref 13.0–18.0)
MCH: 31.6 pg (ref 26.0–34.0)
MCHC: 33.7 g/dL (ref 32.0–36.0)
MCV: 93.9 fL (ref 80.0–100.0)
PLATELETS: 272 10*3/uL (ref 150–440)
RBC: 4.18 MIL/uL — ABNORMAL LOW (ref 4.40–5.90)
RDW: 12.5 % (ref 11.5–14.5)
WBC: 11.1 10*3/uL — ABNORMAL HIGH (ref 3.8–10.6)

## 2014-12-08 LAB — URINE CULTURE: Culture: 80000

## 2014-12-08 MED ORDER — INSULIN ASPART 100 UNIT/ML ~~LOC~~ SOLN
0.0000 [IU] | Freq: Three times a day (TID) | SUBCUTANEOUS | Status: DC
  Administered 2014-12-08: 4 [IU] via SUBCUTANEOUS
  Administered 2014-12-09: 13:00:00 11 [IU] via SUBCUTANEOUS
  Administered 2014-12-09: 7 [IU] via SUBCUTANEOUS
  Administered 2014-12-09: 18:00:00 3 [IU] via SUBCUTANEOUS
  Administered 2014-12-10 (×2): 4 [IU] via SUBCUTANEOUS
  Administered 2014-12-10: 18:00:00 7 [IU] via SUBCUTANEOUS
  Administered 2014-12-11: 11:00:00 3 [IU] via SUBCUTANEOUS
  Administered 2014-12-11: 14:00:00 11 [IU] via SUBCUTANEOUS
  Filled 2014-12-08: qty 1
  Filled 2014-12-08: qty 11
  Filled 2014-12-08: qty 7
  Filled 2014-12-08: qty 3
  Filled 2014-12-08: qty 11
  Filled 2014-12-08: qty 4
  Filled 2014-12-08: qty 7
  Filled 2014-12-08: qty 3
  Filled 2014-12-08: qty 4
  Filled 2014-12-08: qty 3

## 2014-12-08 MED ORDER — INSULIN GLARGINE 100 UNIT/ML ~~LOC~~ SOLN
25.0000 [IU] | Freq: Every day | SUBCUTANEOUS | Status: DC
  Administered 2014-12-09: 10:00:00 25 [IU] via SUBCUTANEOUS
  Filled 2014-12-08 (×2): qty 0.25

## 2014-12-08 NOTE — Clinical Social Work Note (Signed)
Clinical Social Worker updated MD. No current bed offers. CSW will expand search for SNF placement. CSW will continue to follow.   Dede QuerySarah Enjoli Tidd, MSW, LCSW Clinical Social Worker  [phone]

## 2014-12-08 NOTE — Progress Notes (Signed)
ANTIBIOTIC CONSULT NOTE - FOLLOW UP  Pharmacy Consult for Vancomycin/Zosyn Indication: rule out sepsis  Allergies  Allergen Reactions  . Bee Venom   . Cinnamon     Patient Measurements: Height: 6\' 3"  (190.5 cm) Weight: 282 lb (127.914 kg) IBW/kg (Calculated) : 84.5 Adjusted Body Weight:   Vital Signs: Temp: 98.3 F (36.8 C) (05/20 0538) Temp Source: Oral (05/20 0538) BP: 126/61 mmHg (05/20 0538) Pulse Rate: 67 (05/20 0538) Intake/Output from previous day: 05/19 0701 - 05/20 0700 In: 300 [IV Piggyback:300] Out: 1450 [Urine:1450] Intake/Output from this shift: Total I/O In: -  Out: 300 [Urine:300]  Labs:  Recent Labs  12/05/14 2320 12/06/14 0437 12/08/14 0444  WBC 18.4* 15.7* 11.1*  HGB 13.2 13.3 13.2  PLT 238 236 272  CREATININE 0.80 0.74 0.64   Estimated Creatinine Clearance: 134.5 mL/min (by C-G formula based on Cr of 0.64).  Recent Labs  12/06/14 0437 12/07/14 1142  VANCOTROUGH 6* 14     Microbiology: Recent Results (from the past 720 hour(s))  Urine culture     Status: None (Preliminary result)   Collection Time: 12/05/14  4:13 PM  Result Value Ref Range Status   Specimen Description URINE, CLEAN CATCH  Final   Special Requests NONE  Final   Culture   Final    80,000 COLONIES/ml YEAST IDENTIFICATION TO FOLLOW    Report Status PENDING  Incomplete  Culture, blood (routine x 2)     Status: None (Preliminary result)   Collection Time: 12/05/14  9:32 PM  Result Value Ref Range Status   Specimen Description BLOOD  Final   Special Requests NONE  Final   Culture NO GROWTH 3 DAYS  Final   Report Status PENDING  Incomplete  Culture, blood (routine x 2)     Status: None (Preliminary result)   Collection Time: 12/05/14  9:32 PM  Result Value Ref Range Status   Specimen Description BLOOD  Final   Special Requests NONE  Final   Culture  Setup Time   Final    GRAM POSITIVE COCCI IN CHAINS IN BOTH AEROBIC AND ANAEROBIC BOTTLES IDENTIFICATION TO  FOLLOW CRITICAL RESULT CALLED TO, READ BACK BY AND VERIFIED WITH: SHAY BREWER AT 1455 [facility] CRITICAL VALUE NOTED.  VALUE IS CONSISTENT WITH PREVIOUSLY REPORTED AND CALLED VALUE. TSH 12/06/14.    Culture   Final    GRAM POSITIVE COCCI IN BOTH AEROBIC AND ANAEROBIC BOTTLES IDENTIFICATION TO FOLLOW    Report Status PENDING  Incomplete  Wound culture     Status: None (Preliminary result)   Collection Time: 12/05/14 11:10 PM  Result Value Ref Range Status   Specimen Description SACRAL  Final   Special Requests NONE  Final   Gram Stain   Final    RARE WBC SEEN FEW GRAM POSITIVE COCCI IN PAIRS RARE GRAM NEGATIVE RODS    Culture   Final    LIGHT GROWTH GRAM NEGATIVE RODS IDENTIFICATION TO FOLLOW SUSCEPTIBILITIES TO FOLLOW    Report Status PENDING  Incomplete  Wound culture     Status: None (Preliminary result)   Collection Time: 12/05/14 11:10 PM  Result Value Ref Range Status   Specimen Description ARM  Final   Special Requests SWAB  Final   Gram Stain   Final    RARE WBC SEEN MODERATE GRAM POSITIVE COCCI IN PAIRS IN SINGLES FEW GRAM POSITIVE RODS FEW GRAM NEGATIVE RODS    Culture   Final    MODERATE GROWTH GRAM NEGATIVE RODS LIGHT  GROWTH GRAM NEGATIVE RODS IDENTIFICATION TO FOLLOW SUSCEPTIBILITIES TO FOLLOW    Report Status PENDING  Incomplete    Anti-infectives    Start     Dose/Rate Route Frequency Ordered Stop   12/07/14 2000  vancomycin (VANCOCIN) 1,500 mg in sodium chloride 0.9 % 500 mL IVPB     1,500 mg 250 mL/hr over 120 Minutes Intravenous Every 8 hours 12/07/14 1347     12/06/14 1100  piperacillin-tazobactam (ZOSYN) IVPB 3.375 g     3.375 g 12.5 mL/hr over 240 Minutes Intravenous 3 times per day 12/06/14 1033     12/06/14 0400  vancomycin (VANCOCIN) 1,250 mg in sodium chloride 0.9 % 250 mL IVPB  Status:  Discontinued     1,250 mg 166.7 mL/hr over 90 Minutes Intravenous Every 8 hours 12/05/14 2314 12/07/14 1347   12/05/14 1915  vancomycin (VANCOCIN) IVPB 1000  mg/200 mL premix     1,000 mg 200 mL/hr over 60 Minutes Intravenous  Once 12/05/14 1903 12/06/14 2206      Assessment: Patient is a 64 yo male admitted with possible sepsis.  Patient with infected sacral decub ulcers and receiving treatment with IV Vancomycin/Zosyn. GPC/GNR in wounds on arm and sacrum. GPCs in blood cultures.  Trough 5/19 @ 11:42 = 1414mcg/ml  Goal of Therapy:  Goal Vancomycin trough of 15-20   Plan:  Current orders for Vancomycin 1250 mg IV q8h.   Previous vancomycin dose given ~ 2 hours late, so true trough is lower than 14. Will increase dose to vancomycin 1500mg  IV Q8H.  Trough ordered prior to 5th dose 5/21 at 330. Will monitor closely for accumulation of drug in this patient due to obesity.   Will continue Zosyn 3.375 gm IV q8h  Pharmacy will continue to follow.  Garlon HatchetJody Airica Schwartzkopf, PharmD Clinical Pharmacist 12/08/2014 8:41 AM

## 2014-12-08 NOTE — Progress Notes (Signed)
Inpatient Diabetes Program Recommendations  AACE/ADA: New Consensus Statement on Inpatient Glycemic Control (2013)  Target Ranges:  Prepandial:   less than 140 mg/dL      Peak postprandial:   less than 180 mg/dL (1-2 hours)      Critically ill patients:  140 - 180 mg/dL   Reason for Assessment: elevated A1C and CBG  Diabetes history: Type 2 since age 89yrs Outpatient Diabetes medications: None in the past 4-5 weeks- previously he was getting Lantus and Novolog sy[facility]; was taking Lantus 50 units qhs and Novolog sliding scale tid based on his blood sugars - both by syringe  Current orders for Inpatient glycemic control: Novolog resistant correction 0-20 units tid, Lantus 20 units qday  Please consider adding Novolog hs correction scale (from glycemic control order set) and mealtime Novolog correction 4 units tid (hold if patient eats less than 50 %). This mealtime insulin is in addition to his Novolog correction already ordered. Fasting blood sugar slightly elevated this am, consider increasing Lantus to 25 units qday.  Susette RacerJulie Garyson Stelly, RN, BA, MHA, CDE Diabetes Coordinator Inpatient Diabetes Program  [phone] (Team Pager) [phone] [facility] Office) 12/08/2014 8:28 AM

## 2014-12-08 NOTE — Consult Note (Signed)
WOC wound consult note Reason for Consult:Re-evaluate wound care and wound healing goals.  No surgery planned at this time.  Wound type:Unstagebale full thickness pressure ulcers to bilateral ischial tuberosities.  Patient is homeless and spent an extended amount of time up to chair. Is not appropriate for surgical intervention at this time.  Is continent.  Heavy drainage from wounds at this time.  Pressure Ulcer POA: Yes Measurement: See assessment from 12/06/14 Wound bed:100% necrotic tissue with foul odor.   Drainage (amount, consistency, odor)Heavy serosanguinous drainage.  Foul odor.  Periwound: Erythema present Dressing procedure/placement/frequency:Cleanse wounds with NS and pat gently dry.  Apply NS moist gauze to wound bed.  Cover with ABD pad and secure with tape.  Change twice daily.  Will not follow at this time.  Please re-consult if needed.  Maple HudsonKaren Ameriah Lint RN BSN CWON Pager [phone]

## 2014-12-08 NOTE — Consult Note (Signed)
  Psychiatry: Follow-up for this 64 year old man with diabetes and skin infection. Patient had no new complaints for me today. Said that his spirits were holding up reasonably well. He did mention that he was served food that he called "poison" earlier today because of his allergy to cinnamon.  Mental status seemed pleasant. Smiling. Happy to interact. Full affect. Thoughts generally lucid.  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 Supportive counseling completed. I will follow up but will not make any changes to any medicine.

## 2014-12-08 NOTE — Plan of Care (Signed)
Problem: Discharge Progression Outcomes Goal: Discharge plan in place and appropriate Individualization:  Outcome: Progressing Patient prefers to be called Nathan Caldwell. History of DM, HTN, AFib, & Neuropathy which are not controlled by home medications currently. Patient is homeless and has been unable to obtain medications for the last 4-6 weeks. Patient is alert but very lethargic with generalized increased weakness but uses a wheelchair at baseline.   Patient is a high fall risk and understands how to utilize the call system for assistance.   Goal: Other Discharge Outcomes/Goals Outcome: Progressing Plan of care progress to goal:  Patient complained of low back pain, pain medication given with relief.  Patient remains on telemetry - NSR Hr 79. Patient is now on specialty bed. Isolation precautions remain for MRSA, ESBL and VRE. Dressing to bilateral buttocks changed.

## 2014-12-08 NOTE — Progress Notes (Signed)
[facility] [place] [facility]   PATIENT NAME: Nathan GlaserStephen Kamm    MR#:  [id number]  DATE OF BIRTH:  2005/05/22  SUBJECTIVE:  Came in with bilateral sacral nonhealing ulcers. Homeless  Patient is frustrated as  his bed was not changed on time today. Denies any new complaints  REVIEW OF SYSTEMS:    ROS Constitutional: Positive for malaise/fatigue. Negative for fever, chills and weight loss.  HENT: Negative for ear discharge, ear pain and nosebleeds.  Eyes: Negative for blurred vision, pain and discharge.  Respiratory: Negative for sputum production, shortness of breath, wheezing and stridor.  Cardiovascular: Negative for chest pain, palpitations, orthopnea and PND.  Gastrointestinal: Negative for nausea, vomiting, abdominal pain and diarrhea.  Genitourinary: Negative for urgency and frequency.  Musculoskeletal: Positive for back pain. Negative for joint pain.  Skin: Positive for rash.  Neurological: Positive for weakness. Negative for sensory change, speech change and focal weakness.  Psychiatric/Behavioral: Positive for hallucinations. Negative for depression. The patient is not nervous/anxious.  large bilateral about 5x5 cm foulsmlling ulcers chronic over the buttocks with 100% eschar over it  Tolerating Diet:yes Tolerating PT: NO, Wheelchair bound  DRUG ALLERGIES:   Allergies  Allergen Reactions  . Bee Venom   . Cinnamon     VITALS:  Blood pressure 133/52, pulse 75, temperature 98.2 F (36.8 C), temperature source Oral, resp. rate 18, height 6\' 3"  (1.905 m), weight 127.914 kg (282 lb), SpO2 98 %.  PHYSICAL EXAMINATION:   Physical Exam  GENERAL:  64 y.o.-year-old patient lying in the bed with no acute distress.  EYES: Pupils equal, round, reactive to light and accommodation. No scleral icterus. Extraocular muscles intact.  HEENT: Head atraumatic, normocephalic. Oropharynx and nasopharynx clear.  NECK:  Supple, no jugular venous  distention. No thyroid enlargement, no tenderness.  LUNGS: Normal breath sounds bilaterally, no wheezing, rales, rhonchi. No use of accessory muscles of respiration.  CARDIOVASCULAR: S1, S2 normal. No murmurs, rubs, or gallops.  ABDOMEN: Soft, nontender, nondistended. Bowel sounds present. No organomegaly or mass.  EXTREMITIES: No cyanosis, clubbing or edema b/l.    NEUROLOGIC: Cranial nerves II through XII are intact. No focal Motor or sensory deficits b/l.   PSYCHIATRIC: The patient is alert and oriented x 3.  SKIN:large bilateral about 5x5 cm foulsmlling ulcers chronic over the buttocks with 100% eschar over i  LABORATORY PANEL:   CBC  Recent Labs Lab 12/08/14 0444  WBC 11.1*  HGB 13.2  HCT 39.2*  PLT 272   ------------------------------------------------------------------------------------------------------------------  Chemistries   Recent Labs Lab 12/05/14 1636  12/08/14 0444  NA 129*  < > 132*  K 3.6  < > 3.6  CL 93*  < > 99*  CO2 26  < > 28  GLUCOSE 307*  < > 195*  BUN 13  < > 10  CREATININE 0.86  < > 0.64  CALCIUM 8.2*  < > 8.4*  AST 16  --   --   ALT 11*  --   --   ALKPHOS 63  --   --   BILITOT 0.9  --   --   < > = values in this interval not displayed. ------------------------------------------------------------------------------------------------------------------  Cardiac Enzymes  Recent Labs Lab 12/05/14 1636  TROPONINI 0.03   ------------------------------------------------------------------------------------------------------------------  RADIOLOGY:  No results found.   ASSESSMENT AND PLAN:    #1 sepsis-from infected sacral decub Ulcers patient meets septic criteria with leukocytosis, sinus tachycardia at the time of admission Blood cultures, 1 set with  Enterococcus faecalis and the other set with negative growth so far and wound cultures are ordered.wound culture with the staphylococcal aureus and some gram-negative rods -IV vanc and  zosyn -  Appreciate Surgical consult , not considering surgical debridement  #2 diabetes mellitus type 2-uncontrolled; patient is currently hyperglycemic Patient has stopped taking medications for the past 5-6 weeks -Insulin Lantus is increased to 25 units and high dose ssi including bedtime coverage, long-acting insulin needs to be changed to 70/30 at the time of discharge which is less expensive -compliance is going to be a chronic issue given his financial situation  #3 hypertension-blood pressure is elevated next and patient stopped taking medications for the past 5-6 weeks  on metoprolol 25 mg by mouth twice a day   #4 pseudohyponatremia secondary to hyperglycemia               Provided gentle hydration with IV fluids , sodium is better at 133 yesterday  #5 chronic history of congestive heart failure  currently not fluid overloaded, not on any home medications.  #6 chronic atrial fibrillation-not on any anticoagulation Rate controlled on aspirin 81 mg daily  #7 medical noncompliance and homeless Follow-up with Case management  #8 tobacco abuse-counseled patient to quit smoking for 3-5 minutes. We will continue  nicotine patch. Patient is agreeable to quit smoking.  Case discussed with Care Management/Social Worker. Management plans discussed with the patient, family and they are in agreement.  CODE STATUS: full  DVT Prophylaxis: heparin  TOTAL TIME TAKING CARE OF THIS PATIENT: 30 minutes.   POSSIBLE D/C IN 1-2  DAYS, DEPENDING ON CLINICAL CONDITION.   Ramonita LabGouru, Latiya Navia M.D on 12/08/2014 at 2:06 PM  Between 7am to 6pm - Pager - [phone]  After 6pm go to [email or web address] - password [email or web address] [facility]  [facility]  Office  [phone]  CC: Primary care physician; No PCP Per Patient

## 2014-12-08 NOTE — Progress Notes (Signed)
PT Cancellation Note  Patient Details Name: Nathan Caldwell MRN: [id number] DOB: Jan 27, 1951   Cancelled Treatment:      Chart reviewed, RN consulted. Pt refusing pt treatment at this time due to reports of fatigue from poor night's sleep. Will attempt at later date/time.     Buccola,Allan C 12/08/2014, 3:15 PM  Rosamaria LintsAllan C Buccola, PT, DPT

## 2014-12-09 LAB — GLUCOSE, CAPILLARY
GLUCOSE-CAPILLARY: 248 mg/dL — AB (ref 65–99)
GLUCOSE-CAPILLARY: 290 mg/dL — AB (ref 65–99)
Glucose-Capillary: 141 mg/dL — ABNORMAL HIGH (ref 65–99)
Glucose-Capillary: 236 mg/dL — ABNORMAL HIGH (ref 65–99)

## 2014-12-09 LAB — WOUND CULTURE

## 2014-12-09 LAB — CULTURE, BLOOD (ROUTINE X 2)

## 2014-12-09 LAB — VANCOMYCIN, TROUGH
VANCOMYCIN TR: 26 ug/mL — AB (ref 10.0–20.0)
Vancomycin Tr: 19 ug/mL (ref 10–20)

## 2014-12-09 MED ORDER — INSULIN GLARGINE 100 UNIT/ML ~~LOC~~ SOLN
30.0000 [IU] | Freq: Every day | SUBCUTANEOUS | Status: DC
  Administered 2014-12-10 – 2014-12-11 (×2): 30 [IU] via SUBCUTANEOUS
  Filled 2014-12-09 (×3): qty 0.3

## 2014-12-09 MED ORDER — VANCOMYCIN HCL 10 G IV SOLR
1500.0000 mg | Freq: Two times a day (BID) | INTRAVENOUS | Status: DC
  Administered 2014-12-09 – 2014-12-11 (×5): 1500 mg via INTRAVENOUS
  Filled 2014-12-09 (×7): qty 1500

## 2014-12-09 MED ORDER — INSULIN ASPART 100 UNIT/ML ~~LOC~~ SOLN
0.0000 [IU] | Freq: Every day | SUBCUTANEOUS | Status: DC
  Administered 2014-12-09: 23:00:00 2 [IU] via SUBCUTANEOUS
  Filled 2014-12-09: qty 2

## 2014-12-09 NOTE — Progress Notes (Addendum)
[facility] [place] [facility]   PATIENT NAME: Nathan Caldwell    MR#:  [id number]  DATE OF BIRTH:  [birth year]  SUBJECTIVE:  Came in with bilateral sacral nonhealing ulcers. Homeless  Patient denies any new complaints. Homeless  REVIEW OF SYSTEMS:    ROS Constitutional: Positive for malaise/fatigue. Negative for fever, chills and weight loss.  HENT: Negative for ear discharge, ear pain and nosebleeds.  Eyes: Negative for blurred vision, pain and discharge.  Respiratory: Negative for sputum production, shortness of breath, wheezing and stridor.  Cardiovascular: Negative for chest pain, palpitations, orthopnea and PND.  Gastrointestinal: Negative for nausea, vomiting, abdominal pain and diarrhea.  Genitourinary: Negative for urgency and frequency.  Musculoskeletal: Denies any back pain. Negative for joint pain.  Skin: Positive for decub ulcer  Neurological: Positive for weakness. Negative for sensory change, speech change and focal weakness.  Psychiatric/Behavioral: Negative for depression. The patient is not nervous/anxious.  large bilateral about 5x5 cm foulsmlling ulcers chronic over the buttocks with 100% eschar over it  Tolerating Diet:yes Tolerating PT: NO, Wheelchair bound  DRUG ALLERGIES:   Allergies  Allergen Reactions  . Bee Venom   . Cinnamon     VITALS:  Blood pressure 158/70, pulse 66, temperature 98.3 F (36.8 C), temperature source Oral, resp. rate 18, height 6\' 3"  (1.905 m), weight 127.914 kg (282 lb), SpO2 97 %.  PHYSICAL EXAMINATION:   Physical Exam  GENERAL:  64 y.o.-year-old patient lying in the bed with no acute distress.  EYES: Pupils equal, round, reactive to light and accommodation. No scleral icterus. Extraocular muscles intact.  HEENT: Head atraumatic, normocephalic. Oropharynx and nasopharynx clear.  NECK:  Supple, no jugular venous distention. No thyroid enlargement, no tenderness.  LUNGS: Normal breath  sounds bilaterally, no wheezing, rales, rhonchi. No use of accessory muscles of respiration.  CARDIOVASCULAR: S1, S2 normal. No murmurs, rubs, or gallops.  ABDOMEN: Soft, nontender, nondistended. Bowel sounds present. No organomegaly or mass.  EXTREMITIES: No cyanosis, clubbing or edema b/l.    NEUROLOGIC: Cranial nerves II through XII are intact. No focal Motor or sensory deficits b/l.   PSYCHIATRIC: The patient is alert and oriented x 3.  SKIN:large bilateral about 5x5 cm foulsmlling ulcers chronic over the buttocks with 100% eschar over i  LABORATORY PANEL:   CBC  Recent Labs Lab 12/08/14 0444  WBC 11.1*  HGB 13.2  HCT 39.2*  PLT 272   ------------------------------------------------------------------------------------------------------------------  Chemistries   Recent Labs Lab 12/05/14 1636  12/08/14 0444  NA 129*  < > 132*  K 3.6  < > 3.6  CL 93*  < > 99*  CO2 26  < > 28  GLUCOSE 307*  < > 195*  BUN 13  < > 10  CREATININE 0.86  < > 0.64  CALCIUM 8.2*  < > 8.4*  AST 16  --   --   ALT 11*  --   --   ALKPHOS 63  --   --   BILITOT 0.9  --   --   < > = values in this interval not displayed. ------------------------------------------------------------------------------------------------------------------  Cardiac Enzymes  Recent Labs Lab 12/05/14 1636  TROPONINI 0.03   ------------------------------------------------------------------------------------------------------------------  RADIOLOGY:  No results found.   ASSESSMENT AND PLAN:    #1 sepsis-from infected sacral decub Ulcers patient meets septic criteria at the time of admission with leukocytosis, sinus tachycardia  Blood cultures, 1 set with Enterococcus faecalis and the other set with negative growth so far and  wound cultures are ordered.wound culture with the staphylococcal aureus and some gram-negative rods -IV vanc and zosyn, will discharge the patient with Augmentin and Bactrim when bed is  available -  Appreciate Surgical consult , not considering surgical debridement  #2 diabetes mellitus type 2-uncontrolled; patient is currently hyperglycemic Patient has stopped taking medications for the past 5-6 weeks -Insulin Lantus is increased to 25 units and high dose ssi including bedtime coverage, long-acting insulin needs to be changed to 70/30 at the time of discharge which is less expensive -compliance is going to be a chronic issue given his financial situation  #3 hypertension-blood pressure is elevated next and patient stopped taking medications for the past 5-6 weeks  on metoprolol 25 mg by mouth twice a day , his blood pressure is elevated will increase the dose to 50 mg twice a day in a.m.  #4 pseudohyponatremia secondary to hyperglycemia               Provided gentle hydration with IV fluids , sodium is better  #5 chronic history of congestive heart failure  currently not fluid overloaded, not on any home medications.  #6 chronic atrial fibrillation-not on any anticoagulation Rate controlled on aspirin 81 mg daily  #7 medical noncompliance and homeless Follow-up with Case management  #8 tobacco abuse-counseled patient to quit smoking for 3-5 minutes. We will continue  nicotine patch. Patient is agreeable to quit smoking.  Case discussed with Care Management/Social Worker, currently no bed offers available Management plans discussed with the patient, family and they are in agreement.  CODE STATUS: full  DVT Prophylaxis: heparin  TOTAL TIME TAKING CARE OF THIS PATIENT: 30 minutes.   POSSIBLE D/C IN 1-2  DAYS, when bed is available skilled care   Ramonita Lab M.D on 12/09/2014 at 3:27 PM  Between 7am to 6pm - Pager - [phone]  After 6pm go to [email or web address] - password [email or web address] [facility]  [place] [facility]  Office  [phone]  CC: Primary care physician; No PCP Per Patient

## 2014-12-09 NOTE — Plan of Care (Addendum)
Problem: Discharge Progression Outcomes Goal: Discharge plan in place and appropriate Individualization:  Patient prefers to be called Nathan Caldwell. History of DM, HTN, AFib, & Neuropathy which are not controlled by home medications currently. Patient is homeless and has been unable to obtain medications for the last 4-6 weeks. Patient is alert but very lethargic with generalized increased weakness but uses a wheelchair at baseline.    Patient is a high fall risk and understands how to utilize the call system for assistance.    Goal: Other Discharge Outcomes/Goals Outcome: Progressing Plan of care progress to goals: Afebrile. BP/HR stable. Hydrocodone given for chronic back pain with good effect. Dressings changed to buttocks per order.

## 2014-12-09 NOTE — Plan of Care (Signed)
Problem: Discharge Progression Outcomes Goal: Discharge plan in place and appropriate Individualization:  Outcome: Progressing Patient prefers to be called Nathan Caldwell. History of DM, HTN, AFib, & Neuropathy which are not controlled by home medications currently. Patient is homeless and has been unable to obtain medications for the last 4-6 weeks. Patient is alert but very lethargic with generalized increased weakness but uses a wheelchair at baseline.    Patient is a high fall risk and understands how to utilize the call system for assistance.    Goal: Other Discharge Outcomes/Goals Outcome: Progressing Plan of care progress to goal:  Patient complained of low back pain, pain medication given with relief.  Patient remains on telemetry - NSR Hr 67. Patient is now on specialty bed. Isolation precautions remain for MRSA, ESBL and VRE. Dressing to bilateral buttocks  changed. Patient remains on IV antibiotics.

## 2014-12-10 LAB — CULTURE, BLOOD (ROUTINE X 2): Culture: NO GROWTH

## 2014-12-10 LAB — WOUND CULTURE

## 2014-12-10 LAB — GLUCOSE, CAPILLARY
Glucose-Capillary: 166 mg/dL — ABNORMAL HIGH (ref 65–99)
Glucose-Capillary: 199 mg/dL — ABNORMAL HIGH (ref 65–99)
Glucose-Capillary: 214 mg/dL — ABNORMAL HIGH (ref 65–99)
Glucose-Capillary: 195 mg/dL — ABNORMAL HIGH (ref 65–99)

## 2014-12-10 LAB — VANCOMYCIN, TROUGH: VANCOMYCIN TR: 18 ug/mL (ref 10–20)

## 2014-12-10 NOTE — Plan of Care (Signed)
Problem: Discharge Progression Outcomes Goal: Discharge plan in place and appropriate Individualization:  Patient prefers to be called Nathan Caldwell. History of DM, HTN, AFib, & Neuropathy which are not controlled by home medications currently. Patient is homeless and has been unable to obtain medications for the last 4-6 weeks. Patient is alert but very lethargic with generalized increased weakness but uses a wheelchair at baseline.     Patient is a high fall risk and understands how to utilize the call system for assistance.     Goal: Other Discharge Outcomes/Goals Plan of care progress to goal for: Sepsis - Continues on ABX. - Dressing changed this shift. - Complained of pain, hydrocodone given with improvement. Will continue to monitor.

## 2014-12-10 NOTE — Plan of Care (Signed)
Problem: Discharge Progression Outcomes Goal: Discharge plan in place and appropriate Individualization:  Outcome: Progressing Care Plan Note: Pain: Patient able to move around in bed himself. Complained of pain x1 today. Medicated with tablets. No distress noted. Hemodynamics: Vital signs stable - temp 99.1 this afternoon. Patient has generalized edema +2 - feet and scrotum. Complications: Ulcers to buttocks cleaned with normal saline and normal saline wet to dry dressing - gauze applied. Stage 3 ulcers - blackened beds with yellowy foul smelling drainage. Surrounding skin red and excoriated. Diet: Tolerating diet well. Blood glucose 150 - 215 range - covered with insulin as ordered. Last BM several days ago - patient states this is normal for him and he does not want laxatives at present. Activity: Patient remained in bed today - able to move and turn himself.

## 2014-12-10 NOTE — Progress Notes (Signed)
[facility] [place] [facility]   PATIENT NAME: Nathan Caldwell    MR#:  [id number]  DATE OF BIRTH:  Feb 21, 1951  SUBJECTIVE:  Came in with bilateral sacral nonhealing ulcers. Homeless  Patient denies any new complaints. Homeless, reports he is wheelchair bound  REVIEW OF SYSTEMS:    ROS Constitutional: Positive for malaise/fatigue. Negative for fever, chills and weight loss.  HENT: Negative for ear discharge, ear pain and nosebleeds.  Eyes: Negative for blurred vision, pain and discharge.  Respiratory: Negative for sputum production, shortness of breath, wheezing and stridor.  Cardiovascular: Negative for chest pain, palpitations, orthopnea and PND.  Gastrointestinal: Negative for nausea, vomiting, abdominal pain and diarrhea.  Genitourinary: Negative for urgency and frequency.  Musculoskeletal: Denies any back pain. Negative for joint pain.  Skin: Positive for decub ulcer  Neurological: Positive for weakness. Negative for sensory change, speech change and focal weakness.  Psychiatric/Behavioral: Negative for depression. The patient is not nervous/anxious.  large bilateral about 5x5 cm foulsmlling ulcers chronic over the buttocks with 100% eschar over it  Tolerating Diet:yes Tolerating PT: NO, Wheelchair bound  DRUG ALLERGIES:   Allergies  Allergen Reactions  . Bee Venom   . Cinnamon     VITALS:  Blood pressure 122/75, pulse 88, temperature 97.8 F (36.6 C), temperature source Oral, resp. rate 20, height 6\' 3"  (1.905 m), weight 126.1 kg (278 lb), SpO2 92 %.  PHYSICAL EXAMINATION:   Physical Exam  GENERAL:  64 y.o.-year-old patient lying in the bed with no acute distress.  EYES: Pupils equal, round, reactive to light and accommodation. No scleral icterus. Extraocular muscles intact.  HEENT: Head atraumatic, normocephalic. Oropharynx and nasopharynx clear.  NECK:  Supple, no jugular venous distention. No thyroid enlargement, no  tenderness.  LUNGS: Normal breath sounds bilaterally, no wheezing, rales, rhonchi. No use of accessory muscles of respiration.  CARDIOVASCULAR: S1, S2 normal. No murmurs, rubs, or gallops.  ABDOMEN: Soft, nontender, nondistended. Bowel sounds present. No organomegaly or mass.  EXTREMITIES: No cyanosis, clubbing or edema b/l.    NEUROLOGIC: Cranial nerves II through XII are intact. No focal Motor or sensory deficits b/l.   PSYCHIATRIC: The patient is alert and oriented x 3.  SKIN:large bilateral about 5x5 cm foulsmlling ulcers chronic over the buttocks with 100% eschar over i  LABORATORY PANEL:   CBC  Recent Labs Lab 12/08/14 0444  WBC 11.1*  HGB 13.2  HCT 39.2*  PLT 272   ------------------------------------------------------------------------------------------------------------------  Chemistries   Recent Labs Lab 12/05/14 1636  12/08/14 0444  NA 129*  < > 132*  K 3.6  < > 3.6  CL 93*  < > 99*  CO2 26  < > 28  GLUCOSE 307*  < > 195*  BUN 13  < > 10  CREATININE 0.86  < > 0.64  CALCIUM 8.2*  < > 8.4*  AST 16  --   --   ALT 11*  --   --   ALKPHOS 63  --   --   BILITOT 0.9  --   --   < > = values in this interval not displayed. ------------------------------------------------------------------------------------------------------------------  Cardiac Enzymes  Recent Labs Lab 12/05/14 1636  TROPONINI 0.03   ------------------------------------------------------------------------------------------------------------------  RADIOLOGY:  No results found.   ASSESSMENT AND PLAN:    #1 sepsis-from infected sacral decub Ulcers patient meets septic criteria at the time of admission with leukocytosis, sinus tachycardia  Blood cultures, 1 set with Enterococcus faecalis and the other set with  negative growth so far and wound cultures are ordered.wound culture with the staphylococcal aureus and some gram-negative rods -IV vanc and zosyn, will discharge the patient with  Augmentin and Bactrim when bed is available -  Appreciate Surgical consult , not considering surgical debridement -Check a.m. labs CBC, BMP  #2 diabetes mellitus type 2-uncontrolled; patient is currently hyperglycemic Patient has stopped taking medications for the past 5-6 weeks -Insulin Lantus is increased to 25 units and high dose ssi including bedtime coverage, long-acting insulin needs to be changed to 70/30 at the time of discharge which is less expensive -compliance is going to be a chronic issue given his financial situation  #3 hypertension-blood pressure is elevated next and patient stopped taking medications for the past 5-6 weeks  on metoprolol 25 mg by mouth twice a day , his blood pressure is elevated will increase the dose to 50 mg twice a day in a.m.  #4 pseudohyponatremia secondary to hyperglycemia               Provided gentle hydration with IV fluids , sodium is better  #5 chronic history of congestive heart failure  currently not fluid overloaded, not on any home medications.  #6 chronic atrial fibrillation-not on any anticoagulation Rate controlled on aspirin 81 mg daily  #7 medical noncompliance and homeless Follow-up with Case management  #8 tobacco abuse-counseled patient to quit smoking for 3-5 minutes. We will continue  nicotine patch. Patient is agreeable to quit smoking.  Case discussed with Care Management/Social Worker, currently no bed offers available Management plans discussed with the patient, family and they are in agreement.  CODE STATUS: full  DVT Prophylaxis: heparin  TOTAL TIME TAKING CARE OF THIS PATIENT: 30 minutes.   POSSIBLE D/C IN 1-2  DAYS, when bed is available skilled care   Ramonita Lab M.D on 12/10/2014 at 1:27 PM  Between 7am to 6pm - Pager - [phone]  After 6pm go to [email or web address] - password [email or web address] [facility]  [place] [facility]  Office  [phone]  CC: Primary care physician; No PCP Per Patient

## 2014-12-11 LAB — GLUCOSE, CAPILLARY
Glucose-Capillary: 177 mg/dL — ABNORMAL HIGH (ref 65–99)
Glucose-Capillary: 146 mg/dL — ABNORMAL HIGH (ref 65–99)
Glucose-Capillary: 270 mg/dL — ABNORMAL HIGH (ref 65–99)

## 2014-12-11 LAB — BASIC METABOLIC PANEL
ANION GAP: 6 (ref 5–15)
BUN: 9 mg/dL (ref 6–20)
CALCIUM: 8.9 mg/dL (ref 8.9–10.3)
CO2: 31 mmol/L (ref 22–32)
Chloride: 99 mmol/L — ABNORMAL LOW (ref 101–111)
Creatinine, Ser: 0.85 mg/dL (ref 0.61–1.24)
GFR calc non Af Amer: >60 mL/min (ref >60)
Glucose, Bld: 200 mg/dL — ABNORMAL HIGH (ref 65–99)
Potassium: 4.2 mmol/L (ref 3.5–5.1)
Sodium: 136 mmol/L (ref 135–145)

## 2014-12-11 LAB — CBC
HCT: 40.9 % (ref 40.0–52.0)
HEMOGLOBIN: 13.6 g/dL (ref 13.0–18.0)
MCH: 31.6 pg (ref 26.0–34.0)
MCHC: 33.3 g/dL (ref 32.0–36.0)
MCV: 94.7 fL (ref 80.0–100.0)
PLATELETS: 384 10*3/uL (ref 150–440)
RBC: 4.32 MIL/uL — AB (ref 4.40–5.90)
RDW: 13.3 % (ref 11.5–14.5)
WBC: 12.4 10*3/uL — ABNORMAL HIGH (ref 3.8–10.6)

## 2014-12-11 MED ORDER — CIPROFLOXACIN HCL 500 MG PO TABS: 500.0000 mg | ORAL_TABLET | Freq: Two times a day (BID) | ORAL | Status: AC

## 2014-12-11 MED ORDER — INSULIN ASPART 100 UNIT/ML ~~LOC~~ SOLN: 0.0000 [IU] | Freq: Three times a day (TID) | SUBCUTANEOUS | Status: AC

## 2014-12-11 MED ORDER — HYDROCODONE-ACETAMINOPHEN 5-325 MG PO TABS: 1.0000 | ORAL_TABLET | Freq: Four times a day (QID) | ORAL | Status: AC | PRN

## 2014-12-11 MED ORDER — INSULIN GLARGINE 100 UNIT/ML ~~LOC~~ SOLN: 30.0000 [IU] | Freq: Every day | SUBCUTANEOUS | Status: DC

## 2014-12-11 MED ORDER — LISINOPRIL 5 MG PO TABS: 5.0000 mg | ORAL_TABLET | Freq: Every day | ORAL | Status: AC

## 2014-12-11 MED ORDER — INSULIN ASPART PROT & ASPART (70-30 MIX) 100 UNIT/ML ~~LOC~~ SUSP: 30.0000 [IU] | Freq: Two times a day (BID) | SUBCUTANEOUS | Status: AC

## 2014-12-11 MED ORDER — DOCUSATE SODIUM 100 MG PO CAPS: 100.0000 mg | ORAL_CAPSULE | Freq: Every day | ORAL | Status: AC | PRN

## 2014-12-11 MED ORDER — METOPROLOL TARTRATE 25 MG PO TABS: 25.0000 mg | ORAL_TABLET | Freq: Two times a day (BID) | ORAL | Status: AC

## 2014-12-11 MED ORDER — CIPROFLOXACIN HCL 500 MG PO TABS
500.0000 mg | ORAL_TABLET | Freq: Two times a day (BID) | ORAL | Status: DC
  Administered 2014-12-11: 500 mg via ORAL
  Filled 2014-12-11: qty 1

## 2014-12-11 MED ORDER — ACETAMINOPHEN 325 MG PO TABS: 650.0000 mg | ORAL_TABLET | Freq: Four times a day (QID) | ORAL | Status: AC | PRN

## 2014-12-11 MED ORDER — INSULIN ASPART 100 UNIT/ML ~~LOC~~ SOLN: 0.0000 [IU] | Freq: Every day | SUBCUTANEOUS | Status: AC

## 2014-12-11 MED ORDER — ASPIRIN 81 MG PO TBEC: 81.0000 mg | DELAYED_RELEASE_TABLET | Freq: Every day | ORAL | Status: AC

## 2014-12-11 MED ORDER — NICOTINE 21 MG/24HR TD PT24: 21.0000 mg | MEDICATED_PATCH | Freq: Every day | TRANSDERMAL | Status: AC

## 2014-12-11 MED ORDER — INSULIN ASPART PROT & ASPART (70-30 MIX) 100 UNIT/ML ~~LOC~~ SUSP: 30.0000 [IU] | Freq: Two times a day (BID) | SUBCUTANEOUS | Status: DC

## 2014-12-11 NOTE — Clinical Social Work Note (Signed)
LATE ENTRY  [facility]. CSW updated Asst Director re:  LOG. CSW will continue to follow.   Dede QuerySarah Jumaane Weatherford, MSW, LCSW Clinical Social Worker  [phone]

## 2014-12-11 NOTE — Discharge Summary (Signed)
[facility] NAME: Nathan Caldwell    MR#:  [id number]  DATE OF BIRTH:  1951/03/25  DATE OF ADMISSION:  12/05/2014 ADMITTING PHYSICIAN: Nicholes Mango, MD  DATE OF DISCHARGE: 12/11/2014 PRIMARY CARE PHYSICIAN: Primary care physician at the skilled nursing facility  ADMISSION DIAGNOSIS:  SIRS (systemic inflammatory response syndrome) [A41.9] Pressure ulcer [L89.90]  DISCHARGE DIAGNOSIS:  Principal Problem: Sepsis from infected sacral decub with his ulcers with Klebsiella, staph aureus, and Escherichia coli   Active Problems:   Sepsis affecting skin   Diabetes   MRSA (methicillin resistant staph aureus) culture positive   Adjustment disorder with depressed mood   SECONDARY DIAGNOSIS:   Past Medical History  Diagnosis Date  . Hypertension   . Diabetes mellitus without complication   . CHF (congestive heart failure)   . MRSA (methicillin resistant staph aureus) culture positive     1 year ago  . 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   . Diabetic nephropathy   . TIA (transient ischemic attack)     HOSPITAL COURSE:  Brief history and physical-patient came into the ED with a chief complaint of generalized weakness. He was not taking any of his diabetic medications or hypertension medications for the past 6 weeks. Patient is [place]. Please review history and physical for more details.  Hospital course  #1 sepsis-from infected sacral decub Ulcers patient met septic criteria at the time of admission with leukocytosis, sinus tachycardia  Blood cultures, 1 set with Enterococcus faecalis and the other set with negative growth so far and wound cultures are ordered.wound culture with the staphylococcal aureus -IV, Escherichia coli and Klebsiella Patient was treated with IV vanc and zosyn, will discharge the patient with by mouth ciprofloxacin as all pathogens staph  aureus, Klebsiella and Escherichia coli are sensitive to by mouth Cipro  - Appreciate Surgical consult , not considering surgical debridement  #2 diabetes mellitus type 2-uncontrolled; patient is currently hyperglycemic Patient has stopped taking medications for the past 5-6 weeks -Insulin Lantus is increased to 30 units  during the hospital course and high dose ssi including bedtime coverage,  Changing Lantus long-acting insulin to 70/30 subcutaneous twice a day which is less expensive -compliance is going to be a chronic issue given his financial situation  #3 hypertension-blood pressure is elevated next and patient stopped taking medications for the past 5-6 weeks on metoprolol 25 mg by mouth twice a day , his blood pressure is elevated will increase the dose to 50 mg twice a day in a.m.  #4 pseudohyponatremia secondary to hyperglycemia  Provided gentle hydration with IV fluids , sodium is better   #5 chronic history of congestive heart failure  currently not fluid overloaded, not on any home medications. can start him on Lasix if needed   #6 chronic atrial fibrillation-not on any anticoagulation Rate controlled on aspirin 81 mg daily  #7 medical noncompliance and homeless  discharging patient to skilled care facility for rehabilitation   #8 tobacco abuse-counseled patient to quit smoking for 3-5 minutes. We will continue nicotine patch. Patient is agreeable to quit smoking.   DISCHARGE CONDITIONS:   Satisfactory  CONSULTS OBTAINED:  Treatment Team:  Gonzella Lex, MD Nicholes Mango, MD Surgery Dr. Rexene Edison  PROCEDURES none  DRUG ALLERGIES:   Allergies  Allergen Reactions  . Bee Venom   . Cinnamon     DISCHARGE MEDICATIONS:  Current Discharge Medication List    START taking these medications   Details  acetaminophen (TYLENOL) 325 MG tablet Take 2 tablets (650 mg total) by mouth every 6 (six) hours as needed for mild pain (or Fever >/= 101).     aspirin EC 81 MG EC tablet Take 1 tablet (81 mg total) by mouth daily.    ciprofloxacin (CIPRO) 500 MG tablet Take 1 tablet (500 mg total) by mouth 2 (two) times daily. Qty: 15 tablet, Refills: 0    docusate sodium (COLACE) 100 MG capsule Take 1 capsule (100 mg total) by mouth daily as needed for mild constipation. Qty: 10 capsule, Refills: 0    HYDROcodone-acetaminophen (NORCO/VICODIN) 5-325 MG per tablet Take 1-2 tablets by mouth every 6 (six) hours as needed for moderate pain or severe pain. Qty: 20 tablet, Refills: 0    !! insulin aspart (NOVOLOG) 100 UNIT/ML injection Inject 0-20 Units into the skin 3 (three) times daily with meals. Qty: 10 mL, Refills: 11    !! insulin aspart (NOVOLOG) 100 UNIT/ML injection Inject 0-5 Units into the skin at bedtime. Qty: 10 mL, Refills: 11    insulin aspart protamine- aspart (NOVOLOG MIX 70/30) (70-30) 100 UNIT/ML injection Inject 0.3 mLs (30 Units total) into the skin 2 (two) times daily with a meal. Qty: 10 mL, Refills: 11    lisinopril (PRINIVIL,ZESTRIL) 5 MG tablet Take 1 tablet (5 mg total) by mouth daily. Qty: 30 tablet, Refills: 0    metoprolol tartrate (LOPRESSOR) 25 MG tablet Take 1 tablet (25 mg total) by mouth 2 (two) times daily. Qty: 30 tablet, Refills: 0    nicotine (NICODERM CQ - DOSED IN MG/24 HOURS) 21 mg/24hr patch Place 1 patch (21 mg total) onto the skin daily. Qty: 28 patch, Refills: 0     !! - Potential duplicate medications found. Please discuss with provider.       DISCHARGE INSTRUCTIONS:   Follow-up with primary care physician at the facility in 3-4 days Follow-up with diabetic clinic-lifestyle in a week Daily weight monitoring Monitor intake and output and daily basis  DIET:  Healthy heart, diabetic  DISCHARGE CONDITION:  Satisfactory  ACTIVITY:  Wheelchair-bound, as tolerated by the patient Reposition patient every 2-3 hours as he is wheelchair bound  OXYGEN:  Home Oxygen: no      DISCHARGE  LOCATION:   skilled nursing facility     If you experience worsening of your admission symptoms, develop shortness of breath, life threatening emergency, suicidal or homicidal thoughts you must seek medical attention immediately by calling 911 or calling your MD immediately  if symptoms less severe.  You Must read complete instructions/literature along with all the possible adverse reactions/side effects for all the Medicines you take and that have been prescribed to you. Take any new Medicines after you have completely understood and accpet all the possible adverse reactions/side effects.   Please note  You were cared for by a hospitalist during your hospital stay. If you have any questions about your discharge medications or the care you received while you were in the hospital after you are discharged, you can call the unit and asked to speak with the hospitalist on call if the hospitalist that took care of you is not available. Once you are discharged, your primary care physician will handle any further medical issues. Please note that NO REFILLS for any discharge medications will be authorized once you are discharged, as it is imperative that you return to your primary care physician (  or establish a relationship with a primary care physician if you do not have one) for your aftercare needs so that they can reassess your need for medications and monitor your lab values.     Today  Chief Complaint  Patient presents with  . Hyperglycemia    patient is resting comfortably with no new complaints   ROS: CONSTITUTIONAL: Denies fevers, chills. Denies any fatigue, weakness.  EYES: Denies blurry vision, double vision, eye pain. EARS, NOSE, THROAT: Denies tinnitus, ear pain, hearing loss. RESPIRATORY: Denies cough, wheeze, shortness of breath.  CARDIOVASCULAR: Denies chest pain, palpitations, edema.  GASTROINTESTINAL: Denies nausea, vomiting, diarrhea, abdominal pain. Denies bright red blood  per rectum. GENITOURINARY: Denies dysuria, hematuria. ENDOCRINE: Denies nocturia or thyroid problems. HEMATOLOGIC AND LYMPHATIC: Denies easy bruising or bleeding. SKIN: Denies rash or lesion. MUSCULOSKELETAL: Denies pain in neck, back, shoulder, knees, hips or arthritic symptoms.  NEUROLOGIC: Denies paralysis, paresthesias.  PSYCHIATRIC: Denies anxiety or depressive symptoms.   VITAL SIGNS:  Blood pressure 140/58, pulse 66, temperature 98.7 F (37.1 C), temperature source Oral, resp. rate 18, height 6' 3"  (1.905 m), weight 126.554 kg (279 lb), SpO2 96 %.  I/O:    Intake/Output Summary (Last 24 hours) at 12/11/14 1437 Last data filed at 12/11/14 0533  Gross per 24 hour  Intake      0 ml  Output   1550 ml  Net  -1550 ml    PHYSICAL EXAMINATION:  GENERAL:  64 y.o.-year-old patient lying in the bed with no acute distress.  EYES: Pupils equal, round, reactive to light and accommodation. No scleral icterus. Extraocular muscles intact.  HEENT: Head atraumatic, normocephalic. Oropharynx and nasopharynx clear.  NECK:  Supple, no jugular venous distention. No thyroid enlargement, no tenderness.  LUNGS: Normal breath sounds bilaterally, no wheezing, rales,rhonchi or crepitation. No use of accessory muscles of respiration.  CARDIOVASCULAR: S1, S2 normal. No murmurs, rubs, or gallops.  ABDOMEN: Soft, non-tender, non-distended. Bowel sounds present. No organomegaly or mass.  EXTREMITIES: No pedal edema, cyanosis, or clubbing.  NEUROLOGIC: Cranial nerves II through XII are intact. Muscle strength 5/5 in all extremities. Sensation intact. Gait not checked.  PSYCHIATRIC: The patient is alert and oriented x 3.  SKIN: No obvious rash, lesion, or ulcer.   DATA REVIEW:   CBC  Recent Labs Lab 12/11/14 0427  WBC 12.4*  HGB 13.6  HCT 40.9  PLT 384    Chemistries   Recent Labs Lab 12/05/14 1636  12/11/14 0427  NA 129*  < > 136  K 3.6  < > 4.2  CL 93*  < > 99*  CO2 26  < > 31   GLUCOSE 307*  < > 200*  BUN 13  < > 9  CREATININE 0.86  < > 0.85  CALCIUM 8.2*  < > 8.9  AST 16  --   --   ALT 11*  --   --   ALKPHOS 63  --   --   BILITOT 0.9  --   --   < > = values in this interval not displayed.  Cardiac Enzymes  Recent Labs Lab 12/05/14 1636  TROPONINI 0.03    Microbiology Results  Results for orders placed or performed during the hospital encounter of 12/05/14  Urine culture     Status: None   Collection Time: 12/05/14  4:13 PM  Result Value Ref Range Status   Specimen Description URINE, CLEAN CATCH  Final   Special Requests NONE  Final   Culture  Final    80,000 COLONIES/ml CANDIDA ALBICANS MULTIPLE SPECIES PRESENT, SUGGEST RECOLLECTION IF CLINICALLY INDICATED    Report Status 12/08/2014 FINAL  Final  Culture, blood (routine x 2)     Status: None   Collection Time: 12/05/14  9:32 PM  Result Value Ref Range Status   Specimen Description BLOOD  Final   Special Requests NONE  Final   Culture NO GROWTH 5 DAYS  Final   Report Status 12/10/2014 FINAL  Final  Culture, blood (routine x 2)     Status: None   Collection Time: 12/05/14  9:32 PM  Result Value Ref Range Status   Specimen Description BLOOD  Final   Special Requests NONE  Final   Culture  Setup Time   Final    GRAM POSITIVE COCCI IN CHAINS IN BOTH AEROBIC AND ANAEROBIC BOTTLES IDENTIFICATION TO FOLLOW CRITICAL RESULT CALLED TO, READ BACK BY AND VERIFIED WITH: SHAY BREWER AT 1455 CTJ CRITICAL VALUE NOTED.  VALUE IS CONSISTENT WITH PREVIOUSLY REPORTED AND CALLED VALUE. TSH 12/06/14.    Culture   Final    ENTEROCOCCUS FAECALIS STAPHYLOCOCCUS EPIDERMIDIS STAPHYLOCOCCUS CAPITIS CALL MICROBIOLOGY LAB IF SENSITIVITIES ARE REQUIRED.    Report Status 12/09/2014 FINAL  Final   Organism ID, Bacteria ENTEROCOCCUS FAECALIS  Final      Susceptibility   Enterococcus faecalis - MIC*    AMPICILLIN <=2 SENSITIVE Sensitive     LINEZOLID 2 SENSITIVE Sensitive     * ENTEROCOCCUS FAECALIS  Wound  culture     Status: None   Collection Time: 12/05/14 11:10 PM  Result Value Ref Range Status   Specimen Description SACRAL  Final   Special Requests NONE  Final   Gram Stain   Final    RARE WBC SEEN FEW GRAM POSITIVE COCCI IN PAIRS RARE GRAM NEGATIVE RODS    Culture   Final    LIGHT GROWTH ESCHERICHIA COLI MODERATE GROWTH ESCHERICHIA COLI MODERATE GROWTH STAPHYLOCOCCUS AUREUS MODERATE GROWTH STREPTOCOCCUS AGALACTIAE ECOLI HAVE DIFFERENT COLONY MORPHOLOGIES AND SENSITIVITY PATTERNS    Report Status 12/09/2014 FINAL  Final   Organism ID, Bacteria ESCHERICHIA COLI  Final   Organism ID, Bacteria ESCHERICHIA COLI  Final   Organism ID, Bacteria STAPHYLOCOCCUS AUREUS  Final      Susceptibility   Staphylococcus aureus - MIC*    CIPROFLOXACIN <=0.5 SENSITIVE Sensitive     ERYTHROMYCIN <=0.25 SENSITIVE Sensitive     GENTAMICIN <=0.5 SENSITIVE Sensitive     OXACILLIN 0.5 SENSITIVE Sensitive     TETRACYCLINE <=1 SENSITIVE Sensitive     TRIMETH/SULFA <=10 SENSITIVE Sensitive     CLINDAMYCIN <=0.25 SENSITIVE Sensitive     CEFOXITIN SCREEN NEGATIVE Sensitive     Inducible Clindamycin NEGATIVE Sensitive     * MODERATE GROWTH STAPHYLOCOCCUS AUREUS   Escherichia coli - MIC*    AMPICILLIN 4 SENSITIVE Sensitive     CEFTAZIDIME <=1 SENSITIVE Sensitive     CEFAZOLIN <=4 SENSITIVE Sensitive     CEFTRIAXONE <=1 SENSITIVE Sensitive     CIPROFLOXACIN <=0.25 SENSITIVE Sensitive     GENTAMICIN <=1 SENSITIVE Sensitive     IMIPENEM <=0.25 SENSITIVE Sensitive     TRIMETH/SULFA <=20 SENSITIVE Sensitive     CEFOXITIN <=4 SENSITIVE Sensitive    Escherichia coli - MIC*    AMPICILLIN >=32 RESISTANT Resistant     CEFTAZIDIME <=1 SENSITIVE Sensitive     CEFAZOLIN <=4 SENSITIVE Sensitive     CEFTRIAXONE <=1 SENSITIVE Sensitive     CIPROFLOXACIN <=0.25 SENSITIVE Sensitive  GENTAMICIN >=16 RESISTANT Resistant     IMIPENEM <=0.25 SENSITIVE Sensitive     TRIMETH/SULFA >=320 RESISTANT Resistant      CEFOXITIN <=4 SENSITIVE Sensitive     * LIGHT GROWTH ESCHERICHIA COLI    MODERATE GROWTH ESCHERICHIA COLI  Wound culture     Status: None   Collection Time: 12/05/14 11:10 PM  Result Value Ref Range Status   Specimen Description ARM  Final   Special Requests SWAB  Final   Gram Stain   Final    RARE WBC SEEN MODERATE GRAM POSITIVE COCCI IN PAIRS IN SINGLES FEW GRAM POSITIVE RODS FEW GRAM NEGATIVE RODS    Culture   Final    MODERATE GROWTH KLEBSIELLA PNEUMONIAE LIGHT GROWTH ESCHERICHIA COLI RARE GROWTH ESCHERICHIA COLI Susceptibility patterns are not the same. MODERATE GROWTH GROUP B STREP(S.AGALACTIAE)ISOLATED MODERATE GROWTH ENTEROCOCCUS FAECALIS    Report Status 12/10/2014 FINAL  Final   Organism ID, Bacteria KLEBSIELLA PNEUMONIAE  Final   Organism ID, Bacteria ESCHERICHIA COLI  Final   Organism ID, Bacteria STAPHYLOCOCCUS AUREUS  Final      Susceptibility   Staphylococcus aureus - MIC*    CIPROFLOXACIN <=0.5 SENSITIVE Sensitive     ERYTHROMYCIN 0.5 SENSITIVE Sensitive     GENTAMICIN <=0.5 SENSITIVE Sensitive     OXACILLIN <=0.25 SENSITIVE Sensitive     TETRACYCLINE <=1 SENSITIVE Sensitive     TRIMETH/SULFA <=10 SENSITIVE Sensitive     CLINDAMYCIN <=0.25 SENSITIVE Sensitive     CEFOXITIN SCREEN NEGATIVE Sensitive     Inducible Clindamycin NEGATIVE Sensitive     * MODERATE GROWTH STAPHYLOCOCCUS AUREUS   Escherichia coli - MIC*    AMPICILLIN 4 SENSITIVE Sensitive     CEFTAZIDIME <=1 SENSITIVE Sensitive     CEFAZOLIN <=4 SENSITIVE Sensitive     CEFTRIAXONE <=1 SENSITIVE Sensitive     CIPROFLOXACIN <=0.25 SENSITIVE Sensitive     GENTAMICIN <=1 SENSITIVE Sensitive     IMIPENEM <=0.25 SENSITIVE Sensitive     TRIMETH/SULFA <=20 SENSITIVE Sensitive     CEFOXITIN 8 SENSITIVE Sensitive     * LIGHT GROWTH ESCHERICHIA COLI   Klebsiella pneumoniae - MIC*    AMPICILLIN >=32 RESISTANT Resistant     CEFTAZIDIME <=1 SENSITIVE Sensitive     CEFAZOLIN <=4 SENSITIVE Sensitive      CEFTRIAXONE <=1 SENSITIVE Sensitive     CIPROFLOXACIN <=0.25 SENSITIVE Sensitive     GENTAMICIN <=1 SENSITIVE Sensitive     IMIPENEM <=0.25 SENSITIVE Sensitive     TRIMETH/SULFA <=20 SENSITIVE Sensitive     CEFOXITIN <=4 SENSITIVE Sensitive     * MODERATE GROWTH KLEBSIELLA PNEUMONIAE    RADIOLOGY:  No results found.  EKG:   Orders placed or performed during the hospital encounter of 12/05/14  . ED EKG  . ED EKG  . EKG 12-Lead  . EKG 12-Lead      Management plans discussed with the patient, family and they are in agreement.  CODE STATUS:     Code Status Orders        Start     Ordered   12/05/14 2233  Full code   Continuous     12/05/14 2232      TOTAL TIME TAKING CARE OF THIS PATIENT: 45 minutes.    @[facility] @  on 12/11/2014 at 2:37 PM  Between 7am to 6pm - Pager - [phone]  After 6pm go to [email or web address] - password [email or web address] [facility]  [facility]  Office  [phone]  CC: Primary care physician; No PCP  Per Patient

## 2014-12-11 NOTE — Plan of Care (Signed)
Problem: Discharge Progression Outcomes Goal: Discharge plan in place and appropriate Individualization:  Patient prefers to be called Nathan Caldwell. History of DM, HTN, AFib, & Neuropathy which are not controlled by home medications currently. Patient is homeless and has been unable to obtain medications for the last 4-6 weeks. Patient is alert but very lethargic with generalized increased weakness but uses a wheelchair at baseline.     Patient is a high fall risk and understands how to utilize the call system for assistance.     Goal: Other Discharge Outcomes/Goals Plan of care progress to goal for: Sepsis - Continues on ABX. - Dressing changed this shift. - Complained of pain, hydrocodone given with improvement. Will continue to monitor.     

## 2014-12-11 NOTE — Progress Notes (Signed)
Inpatient Diabetes Program Recommendations  AACE/ADA: New Consensus Statement on Inpatient Glycemic Control (2013)  Target Ranges:  Prepandial:   less than 140 mg/dL      Peak postprandial:   less than 180 mg/dL (1-2 hours)      Critically ill patients:  140 - 180 mg/dL   Results for Nathan Caldwell, Nathan Caldwell (MRN [id number]) as of 12/11/2014 11:56  Ref. Range 12/10/2014 07:18 12/10/2014 11:20 12/10/2014 16:25 12/10/2014 22:02 12/11/2014 06:02 12/11/2014 07:48 12/11/2014 11:29  Glucose-Capillary Latest Ref Range: 65-99 mg/dL 951166 (H) 884199 (H) 166214 (H) 195 (H) 177 (H) 146 (H) 270 (H)    Current orders for Inpatient glycemic control: Lantus 30 units daily, Novolog 0-20 units TID with meals, Novolog 0-5 units HS  Inpatient Diabetes Program Recommendations Insulin - Meal Coverage: Post prandial glucose is consistently elevated. Please consider ordering Novolog 5 units TID with meals for meal coverage (in addition to Novolog correction insulin). Outpatient DM medications: Please note that patient will likely need to be changed to 70/30 insulin at discharge due to cost of insulins (Lantus is over $250 per vial and Novolog is over $150 per vial whereas Novolin 70/30 [facility] for $25 per vial).  Thanks, Orlando PennerMarie Rutilio Yellowhair, RN, MSN, CCRN, CDE Diabetes Coordinator Inpatient Diabetes Program [phone] (Team Pager from 8am to 5pm) [phone] ([facility] office) [phone] [facility] office)

## 2014-12-11 NOTE — Clinical Social Work Placement (Signed)
   CLINICAL SOCIAL WORK PLACEMENT  NOTE  Date:  12/11/2014  Patient Details  Name: Nathan Caldwell MRN: [id number] Date of Birth: [birth year]  Clinical Social Work is seeking post-discharge placement for this patient at the Skilled  Nursing Facility level of care (*CSW will initial, date and re-position this form in  chart as items are completed):  Yes   Patient/family provided with [facility] Social Work Department's list of facilities offering this level of care within the geographic area requested by the patient (or if unable, by the patient's family).  Yes   Patient/family informed of their freedom to choose among providers that offer the needed level of care, that participate in Medicare, Medicaid or managed care program needed by the patient, have an available bed and are willing to accept the patient.  Yes   Patient/family informed of [facility] ownership interest in [facility] and [facility], as well as of the fact that they are under no obligation to receive care at these facilities.  PASRR submitted to EDS on       PASRR number received on       Existing PASRR number confirmed on 12/07/14     FL2 transmitted to all facilities in geographic area requested by pt/family on 12/07/14     FL2 transmitted to all facilities within larger geographic area on 12/08/14     Patient informed that his/her managed care company has contracts with or will negotiate with certain facilities, including the following:        Yes   Patient/family informed of bed offers received.  Patient chooses bed at  [facility])     Physician recommends and patient chooses bed at  [facility](SNF)    Patient to be transferred to  [facility]) on 12/11/14.  Patient to be transferred to facility by  [facility]     Patient family notified on 12/11/14 of transfer.  Name of family member notified:   Darl Pikes(Susan, daughter)     PHYSICIAN       Additional  Comment:    _______________________________________________ Dede QuerySarah Kennia Vanvorst, LCSW 12/11/2014, 3:54 PM

## 2014-12-11 NOTE — Clinical Social Work Note (Signed)
Pt is ready for discharge today and will [facility] [place]. Pt is agreeable to discharge plan. CSW updated pt's daughter, Darl PikesSusan, who will follow up with family this evening. RN called report. EMS will provide transportation. CSW Asst Director will send LOG. Pt is Medicaid pending. CSW is signing off as no further needs identified.   Dede QuerySarah Finch Costanzo, MSW, LCSW Clinical Social Worker  [phone]

## 2015-11-22 IMAGING — CT CT LUMBAR SPINE WITHOUT CONTRAST
3 of 9 series · 11 of 33 positions shown, 13 images · non-contrast
Comparison: Thoracic spine CT 07/12/2013.

CLINICAL DATA: 63-year-old male with lumbar back pain (not
otherwise specified at the time of this report).

EXAM:
CT LUMBAR SPINE WITHOUT CONTRAST
TECHNIQUE: Multidetector CT imaging of the lumbar spine was performed without
intravenous contrast administration. Multiplanar CT image
reconstructions were also generated.

[Series 4: l spine soft · axial · 0.41mm/px · z∈[-330,-130]mm · 3 of 175 slices shown, 4 images]
[im 50/175  soft-tissue]
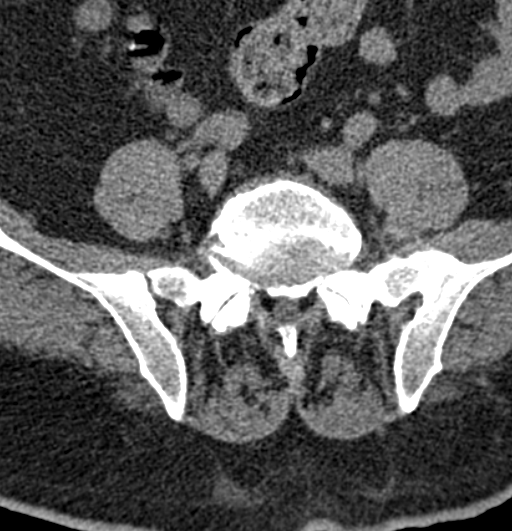
[im 50/175  bone]
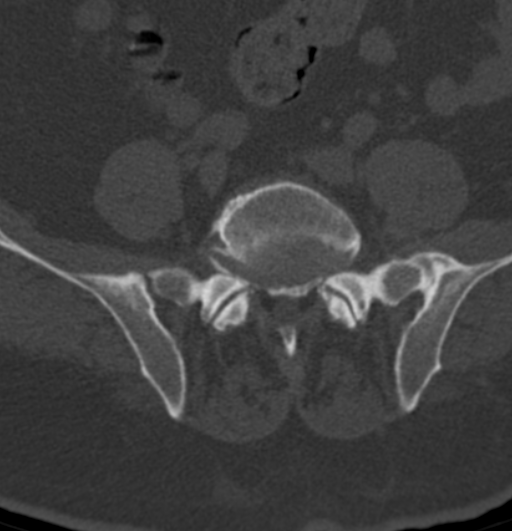
[im 100/175  bone]
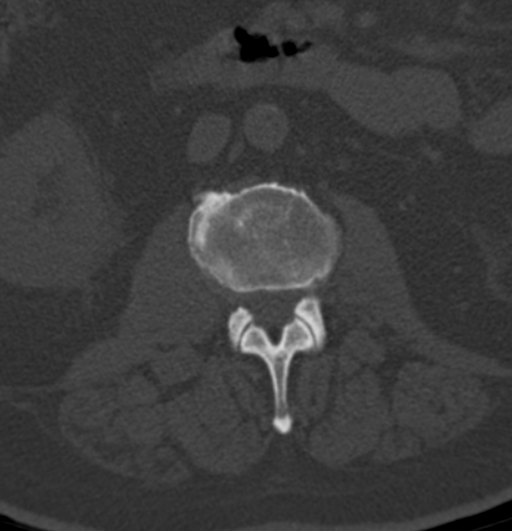
[im 150/175  bone]
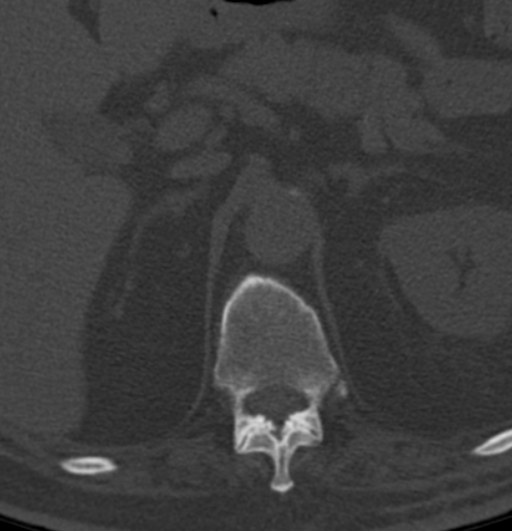

[Series 5: sagittal bone · sagittal · 0.42mm/px · 5 of 115 slices shown, 6 images]
[im 39/115  bone]
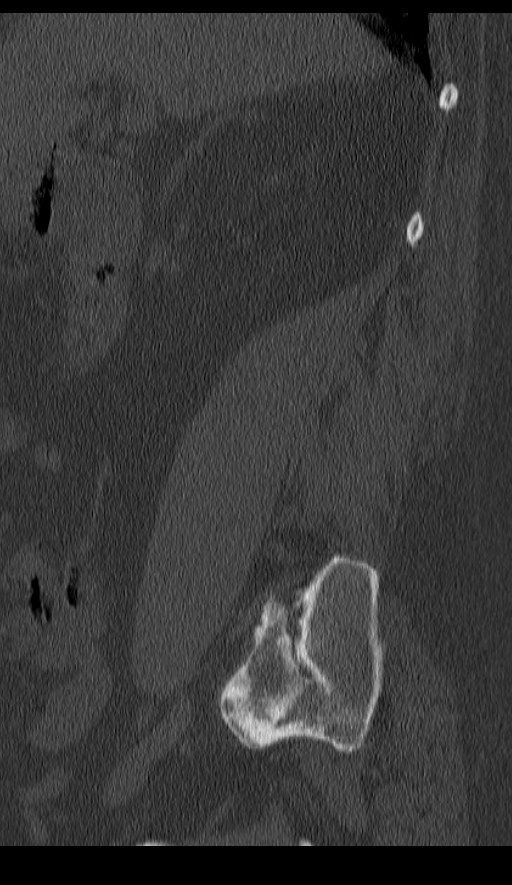
[im 48/115  bone]
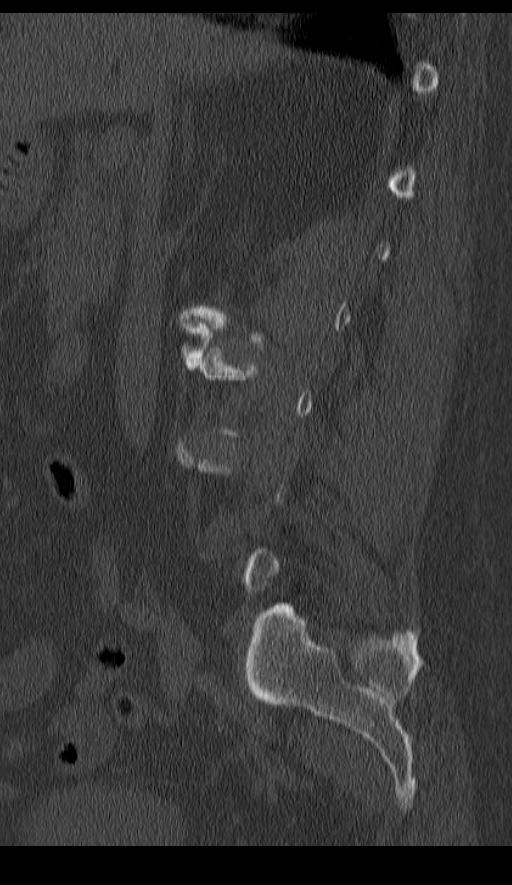
[im 58/115  soft-tissue]
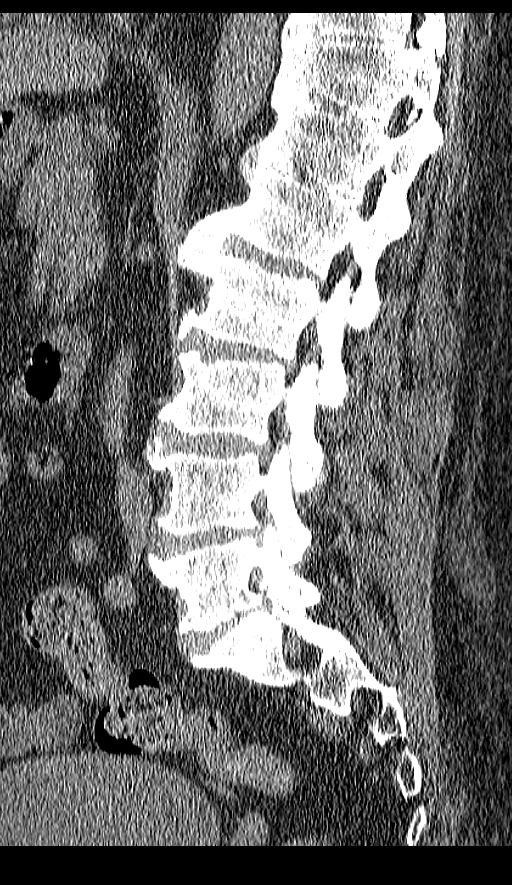
[im 58/115  bone]
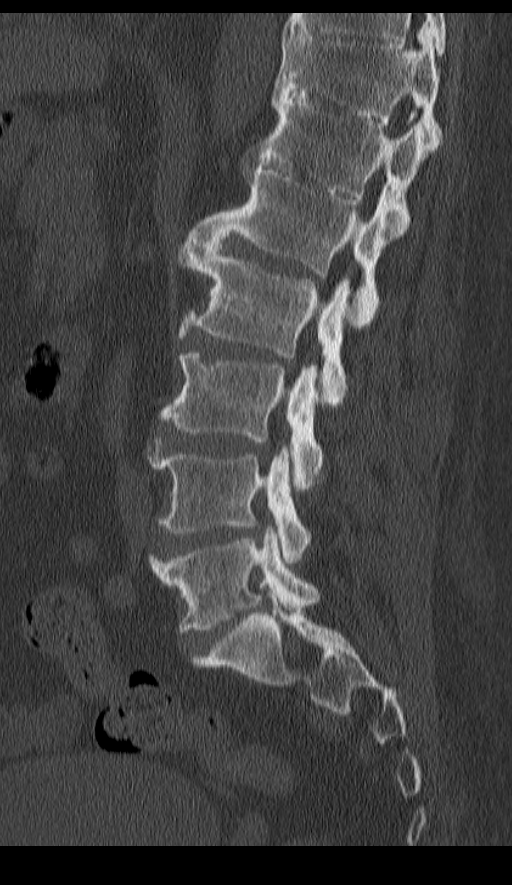
[im 67/115  bone]
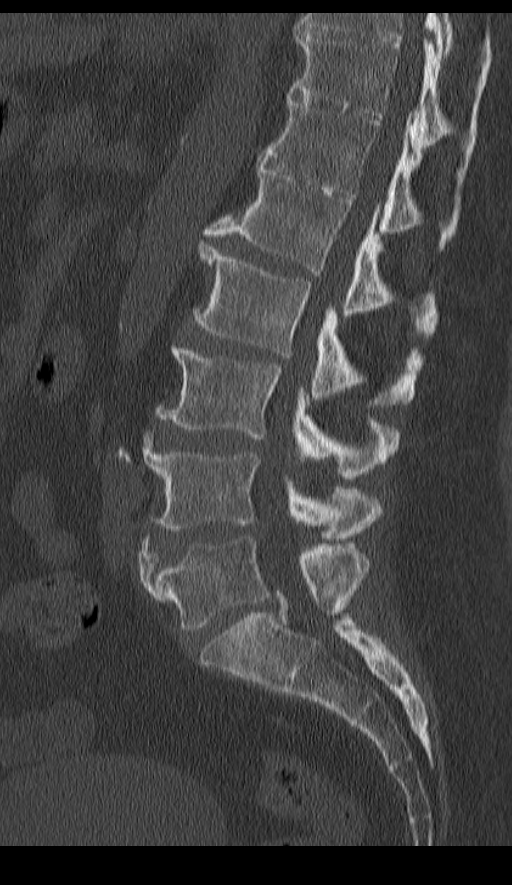
[im 77/115  bone]
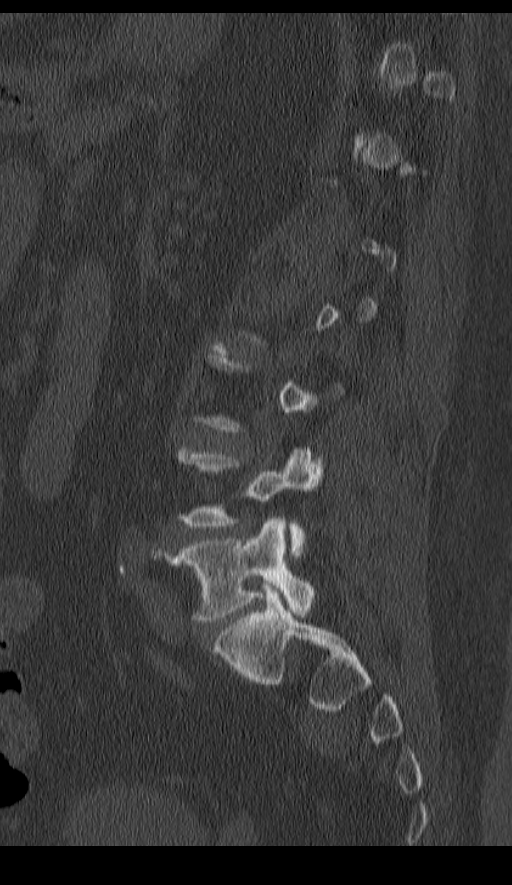

[Series 6: coronal bone · coronal · 0.45mm/px · 3 of 109 slices shown]
[im 22/109  bone]
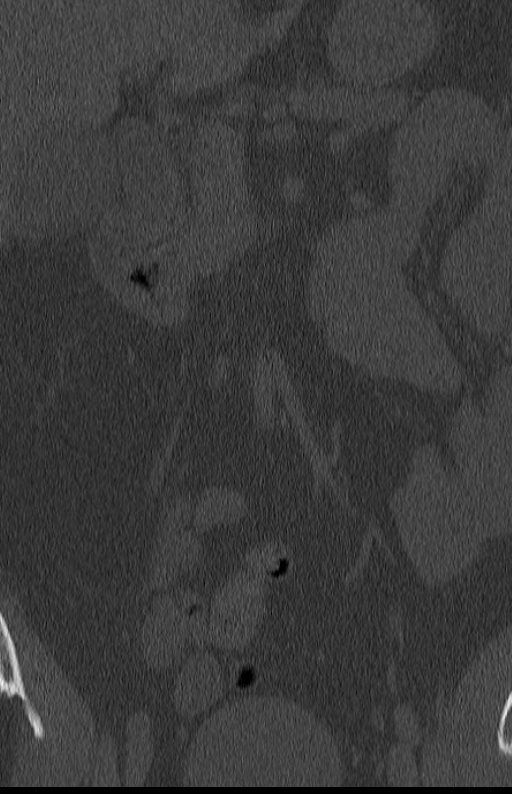
[im 44/109  bone]
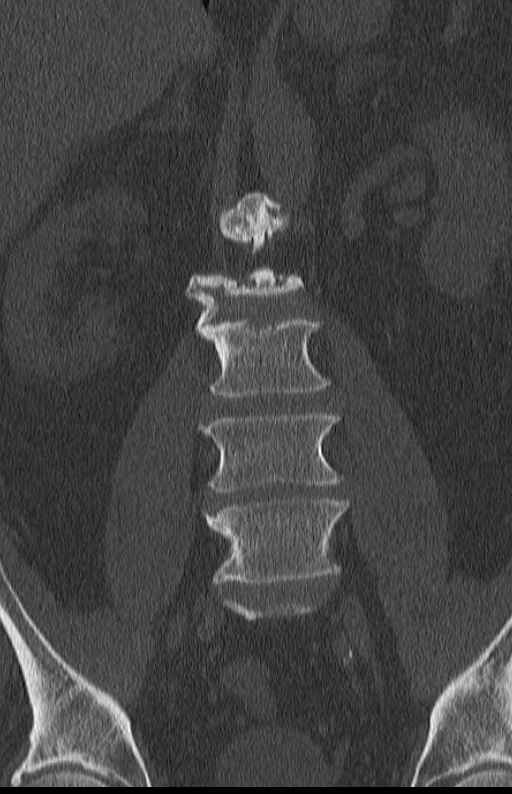
[im 65/109  bone]
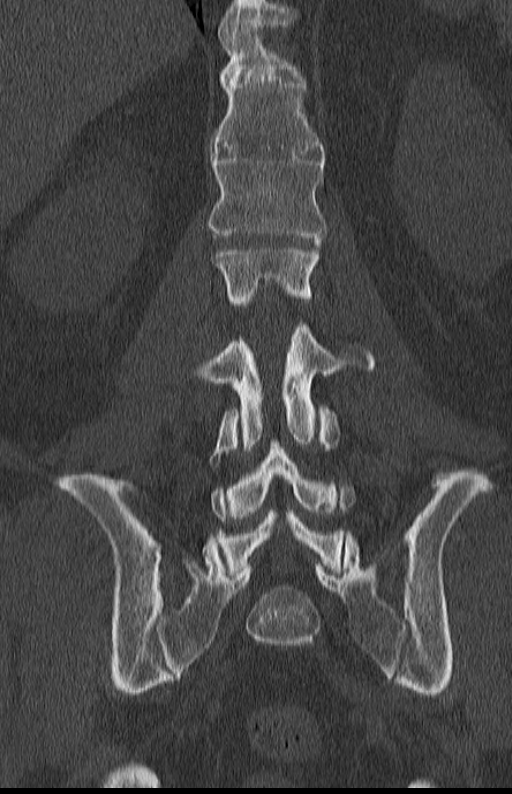

[11 of 33 positions shown; findings below may reference images not displayed]

FINDINGS: Normal lumbar segmentation, with chronic lower thoracic interbody
ankylosis extending to the T12-L1 level as seen on 07/12/2013.

Lumbar vertebrae appear intact. Chronic appearing anterior superior
L5 endplate deformity along with bulky anterior disc osteophyte
complex. Degenerative SI joint overgrowth greater on the left. There
is similar right inferior SI joint ankylosis. Visible sacrum intact.

Aortoiliac calcified atherosclerosis noted. Negative visualized
other non contrast abdominal viscera.

T12-L1:  Ankylosis.  Mild facet hypertrophy.

L1-L2: Interbody ankylosis via a bulky anterior endplate osteophyte.
Circumferential disc osteophyte complex. No significant stenosis.

L2-L3: Circumferential disc osteophyte complex. Mild facet
hypertrophy. Mild left L2 foraminal stenosis.

L3-L4: Circumferential disc osteophyte complex. Moderate facet
hypertrophy. No significant spinal stenosis. Mild L3 foraminal
stenosis greater on the left.

L4-L5: Circumferential disc osteophyte complex. Mild facet
hypertrophy. No significant spinal stenosis. Mild bilateral L4
foraminal stenosis.

L5-S1: Circumferential disc osteophyte complex, calcified posterior
component of disc. Moderate facet hypertrophy. Still, no significant
spinal stenosis suspected. Moderate bilateral L5 foraminal stenosis.
IMPRESSION: 1.  No acute osseous abnormality identified in the lumbar spine.
2. Lower thoracic to L1-L2 ankylosis. Intermittent SI joint
ankylosis.
3. Chronic lumbar disc and endplate degeneration elsewhere with up
to moderate lumbar neural foraminal stenosis, but no significant
lumbar spinal stenosis suspected by CT.

## 2016-04-07 ENCOUNTER — Other Ambulatory Visit: Payer: Self-pay | Admitting: Nurse Practitioner

## 2016-04-07 DIAGNOSIS — Z87898 Personal history of other specified conditions: Secondary | ICD-10-CM

## 2016-04-07 DIAGNOSIS — R748 Abnormal levels of other serum enzymes: Secondary | ICD-10-CM

## 2016-04-11 ENCOUNTER — Ambulatory Visit
Admission: RE | Admit: 2016-04-11 | Discharge: 2016-04-11 | Disposition: A | Discharge status: Home / Self Care | Payer: Medicare Other | Source: Ambulatory Visit | Attending: Nurse Practitioner | Admitting: Nurse Practitioner

## 2016-04-11 DIAGNOSIS — Z87898 Personal history of other specified conditions: Secondary | ICD-10-CM | POA: Diagnosis not present

## 2016-04-11 DIAGNOSIS — I700 Atherosclerosis of aorta: Secondary | ICD-10-CM | POA: Diagnosis not present

## 2016-04-11 DIAGNOSIS — R748 Abnormal levels of other serum enzymes: Secondary | ICD-10-CM | POA: Diagnosis not present

## 2016-04-11 DIAGNOSIS — K4090 Unilateral inguinal hernia, without obstruction or gangrene, not specified as recurrent: Secondary | ICD-10-CM | POA: Diagnosis not present

## 2016-04-11 MED ORDER — IOPAMIDOL (ISOVUE-300) INJECTION 61%
100.0000 mL | Freq: Once | INTRAVENOUS | Status: AC | PRN
  Administered 2016-04-11: 100 mL via INTRAVENOUS

## 2016-06-20 DEATH — deceased

## 2017-06-22 IMAGING — CT CT ABD-PELV W/ CM
2 of 5 series · 17 of 46 positions shown, 19 images · IV contrast (APPLIED)
Comparison: None.

CLINICAL DATA: RIGHT side abdominal pain. Generalized abdominal
pain

EXAM:
CT ABDOMEN AND PELVIS WITH CONTRAST
TECHNIQUE: Multidetector CT imaging of the abdomen and pelvis was performed
using the standard protocol following bolus administration of
intravenous contrast.
CONTRAST:  100mL [id number] IOPAMIDOL ([id number]-[id number]) INJECTION 61%

[Series 2: axial st · axial · 0.98mm/px · z∈[-985,-540]mm · 14 of 101 slices shown, 16 images]
[im 6/101  soft-tissue]
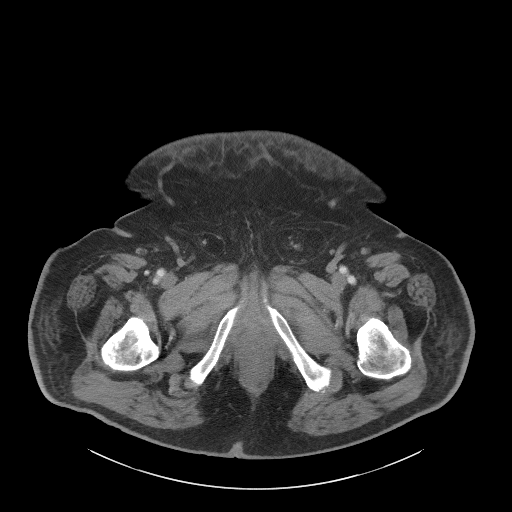
[im 6/101  bone]
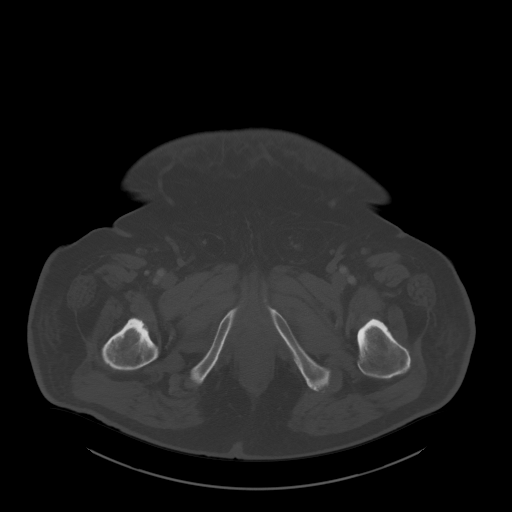
[im 11/101  soft-tissue]
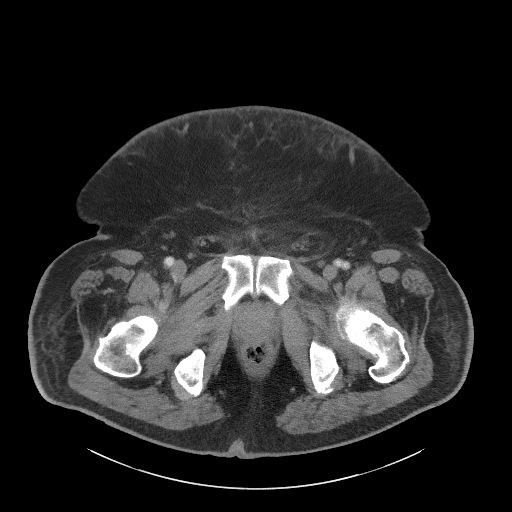
[im 22/101  soft-tissue]
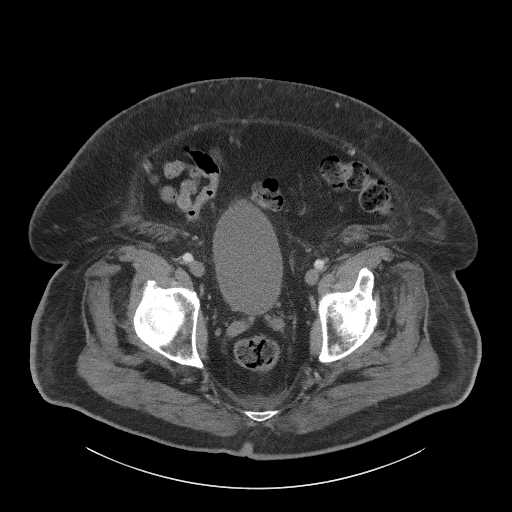
[im 27/101  soft-tissue]
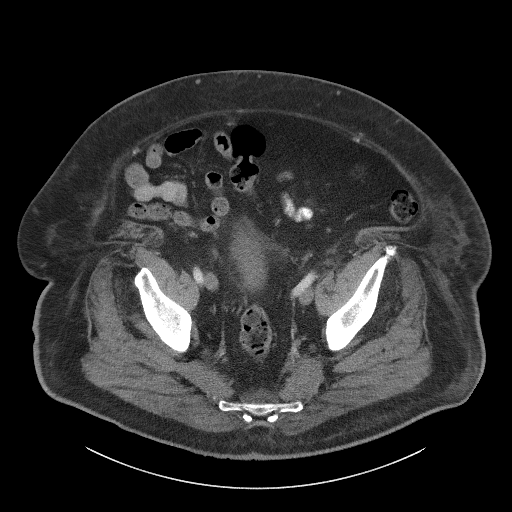
[im 32/101  soft-tissue]
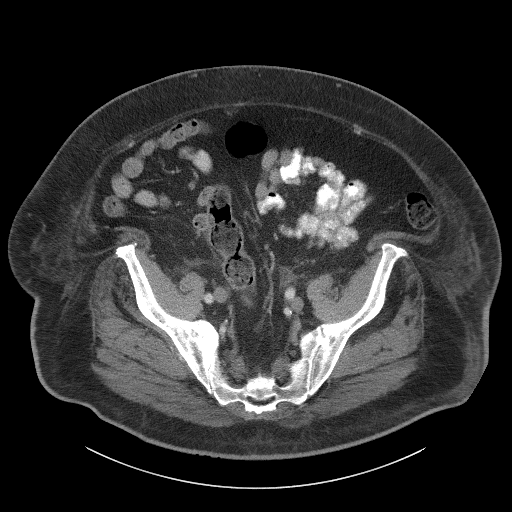
[im 43/101  soft-tissue]
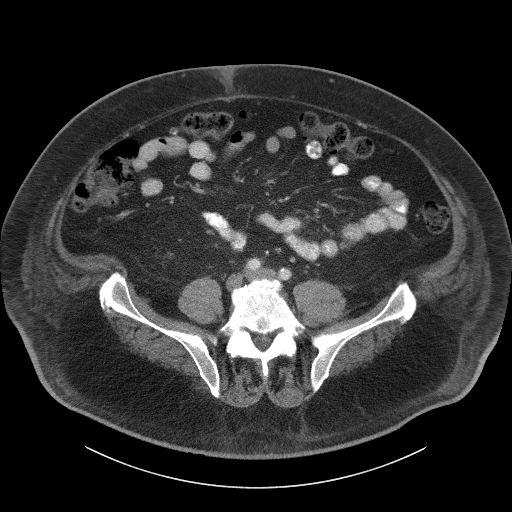
[im 48/101  soft-tissue]
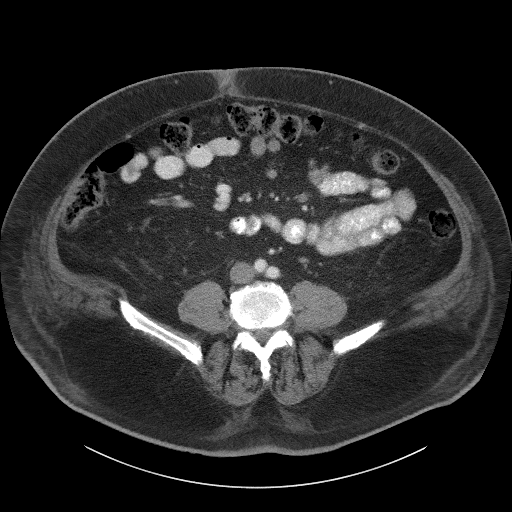
[im 53/101  soft-tissue]
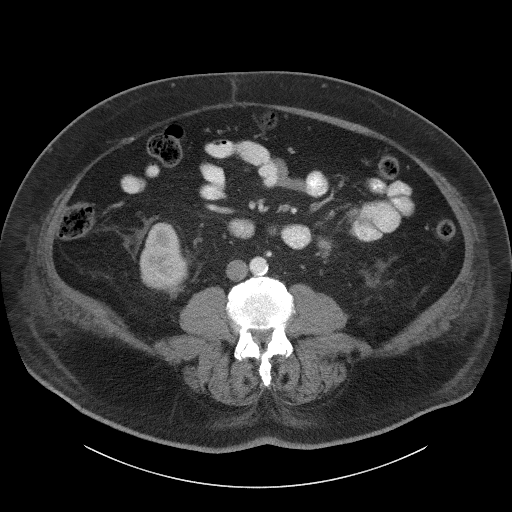
[im 58/101  soft-tissue]
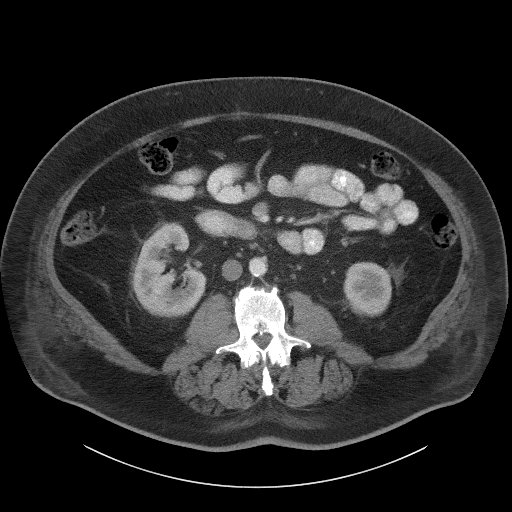
[im 58/101  bone]
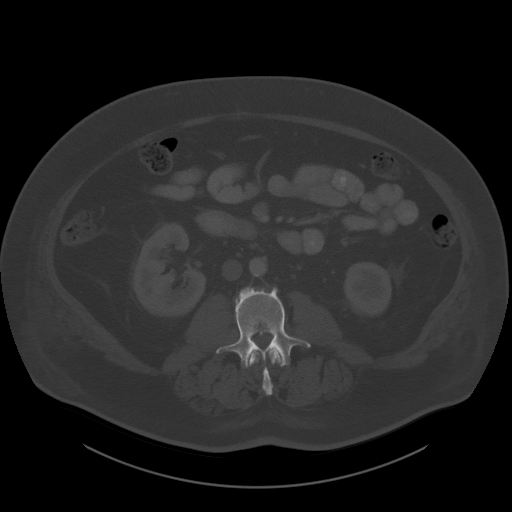
[im 69/101  soft-tissue]
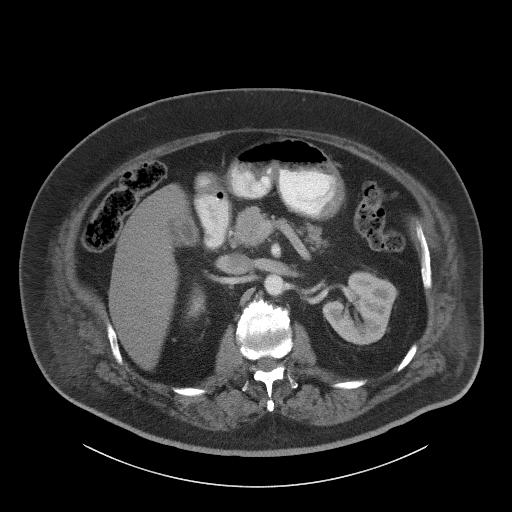
[im 74/101  soft-tissue]
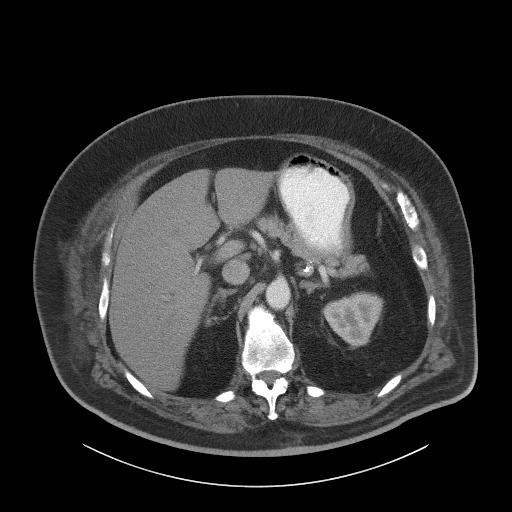
[im 79/101  soft-tissue]
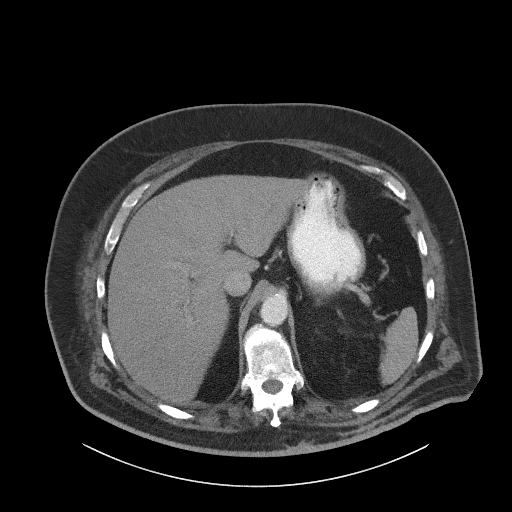
[im 90/101  soft-tissue]
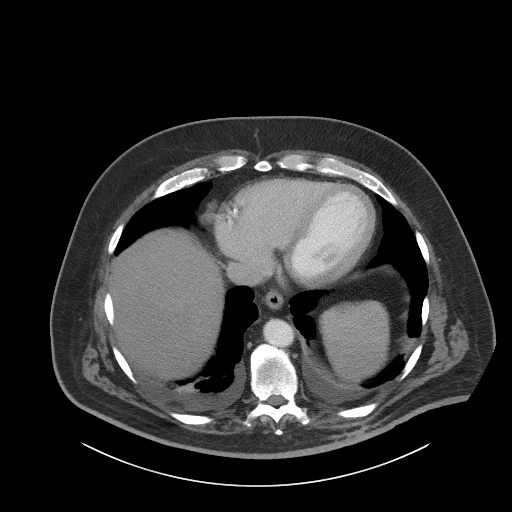
[im 95/101  soft-tissue]
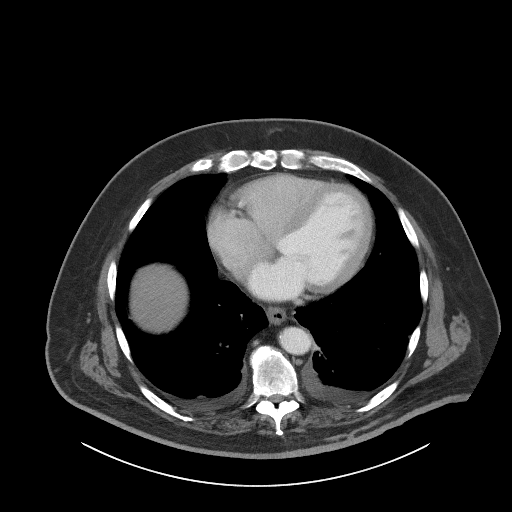

[Series 5: coronal st · coronal · 0.91mm/px · 3 of 108 slices shown]
[im 36/108  soft-tissue]
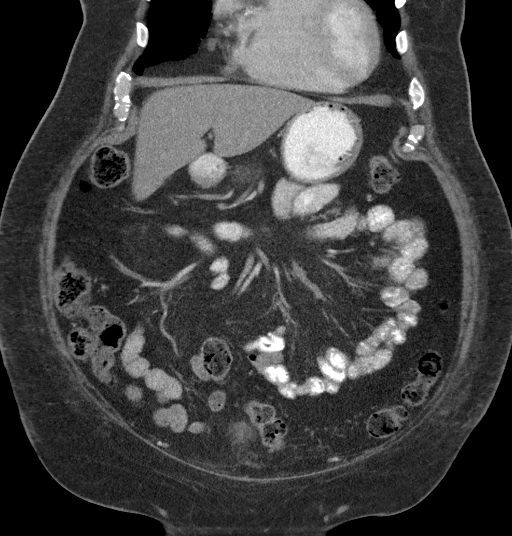
[im 48/108  soft-tissue]
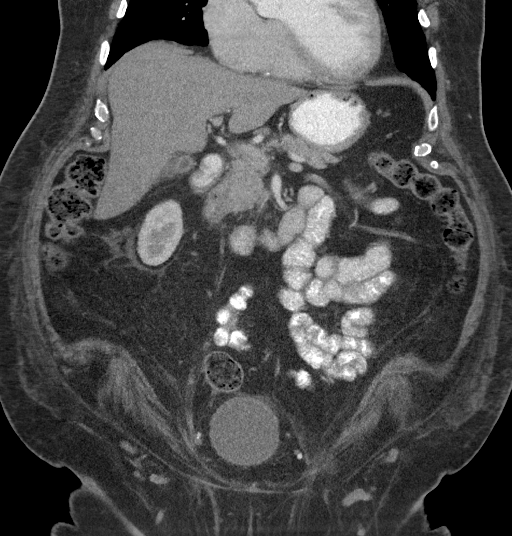
[im 60/108  soft-tissue]
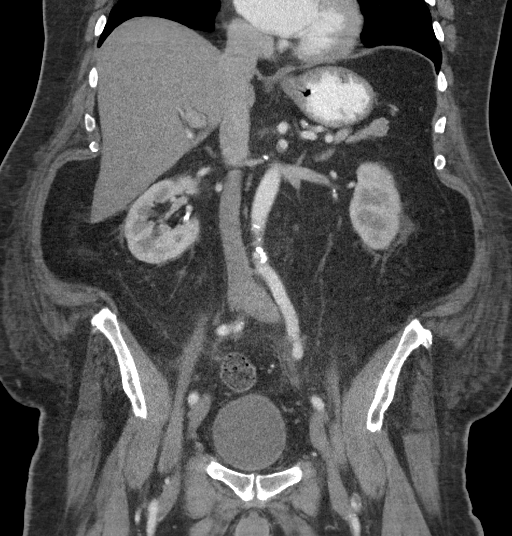

[17 of 46 positions shown; findings below may reference images not displayed]

FINDINGS: Lower chest: Bibasilar atelectasis and small effusions.

Hepatobiliary: No focal hepatic lesion. No duct dilatation.
Gallbladder is collapsed.

Pancreas: Pancreas is normal. No ductal dilatation. No pancreatic
inflammation.

Spleen: Normal spleen

Adrenals/Urinary Tract: Adrenal glands are normal. Kidneys, bladder
and ureters are normal

Stomach/Bowel: Stomach, duodenum, small-bowel, hand cecum are
normal. Normal volume stool through the colon. Rectosigmoid colon is
normal.

Vascular/Lymphatic: Mild atherosclerotic calcification aorta. No
abdominal lymphadenopathy. No pelvic lymphadenopathy

Reproductive: Prostate normal.  Small inguinal hernias.

Other: No free fluid.

Musculoskeletal: Degenerative osteophytosis of the spine.
IMPRESSION: 1. No Acute abdominal or pelvic findings.
2. No abnormality of the bowel.
3. Small fat filled inguinal hernias.
4.  Atherosclerotic calcification of the aorta.
